# Patient Record
Sex: Female | Born: 1975 | Race: White | Hispanic: No | Marital: Married | Smoking: Never smoker
Health system: Southern US, Community
[De-identification: ages and names within clinical notes are randomized; demographics above are authoritative.]

## PROBLEM LIST (undated history)

## (undated) DIAGNOSIS — C73 Malignant neoplasm of thyroid gland: Secondary | ICD-10-CM

## (undated) DIAGNOSIS — E041 Nontoxic single thyroid nodule: Secondary | ICD-10-CM

## (undated) HISTORY — DX: Nontoxic single thyroid nodule: E04.1

---

## 1998-01-29 ENCOUNTER — Other Ambulatory Visit: Admission: RE | Admit: 1998-01-29 | Discharge: 1998-01-29 | Payer: Self-pay | Admitting: *Deleted

## 1998-04-30 ENCOUNTER — Emergency Department (HOSPITAL_COMMUNITY): Admission: EM | Admit: 1998-04-30 | Discharge: 1998-04-30 | Payer: Self-pay | Admitting: Emergency Medicine

## 2001-03-26 ENCOUNTER — Other Ambulatory Visit: Admission: RE | Admit: 2001-03-26 | Discharge: 2001-03-26 | Payer: Self-pay | Admitting: Obstetrics and Gynecology

## 2002-04-16 ENCOUNTER — Other Ambulatory Visit: Admission: RE | Admit: 2002-04-16 | Discharge: 2002-04-16 | Payer: Self-pay | Admitting: *Deleted

## 2003-04-22 ENCOUNTER — Other Ambulatory Visit: Admission: RE | Admit: 2003-04-22 | Discharge: 2003-04-22 | Payer: Self-pay | Admitting: *Deleted

## 2004-02-12 HISTORY — PX: KNEE SURGERY: SHX244

## 2006-01-28 ENCOUNTER — Inpatient Hospital Stay (HOSPITAL_COMMUNITY): Admission: AD | Admit: 2006-01-28 | Discharge: 2006-01-31 | Payer: Self-pay | Admitting: Obstetrics and Gynecology

## 2006-01-29 ENCOUNTER — Encounter (INDEPENDENT_AMBULATORY_CARE_PROVIDER_SITE_OTHER): Payer: Self-pay | Admitting: Specialist

## 2008-01-22 ENCOUNTER — Inpatient Hospital Stay (HOSPITAL_COMMUNITY): Admission: RE | Admit: 2008-01-22 | Discharge: 2008-01-27 | Payer: Self-pay | Admitting: *Deleted

## 2008-01-23 ENCOUNTER — Encounter: Payer: Self-pay | Admitting: Anesthesiology

## 2008-01-23 ENCOUNTER — Encounter (INDEPENDENT_AMBULATORY_CARE_PROVIDER_SITE_OTHER): Payer: Self-pay | Admitting: Neurosurgery

## 2008-01-23 HISTORY — PX: TUMOR EXCISION: SHX421

## 2009-01-15 ENCOUNTER — Ambulatory Visit: Payer: Self-pay | Admitting: Family Medicine

## 2009-01-15 DIAGNOSIS — J069 Acute upper respiratory infection, unspecified: Secondary | ICD-10-CM | POA: Insufficient documentation

## 2010-05-13 ENCOUNTER — Encounter: Admission: RE | Admit: 2010-05-13 | Discharge: 2010-05-13 | Payer: Self-pay | Admitting: Internal Medicine

## 2010-05-17 ENCOUNTER — Encounter: Admission: RE | Admit: 2010-05-17 | Discharge: 2010-05-17 | Payer: Self-pay | Admitting: Internal Medicine

## 2010-05-17 ENCOUNTER — Other Ambulatory Visit: Admission: RE | Admit: 2010-05-17 | Discharge: 2010-05-17 | Payer: Self-pay | Admitting: Interventional Radiology

## 2010-05-30 ENCOUNTER — Ambulatory Visit (HOSPITAL_COMMUNITY): Admission: RE | Admit: 2010-05-30 | Discharge: 2010-05-31 | Payer: Self-pay | Admitting: Surgery

## 2010-05-30 ENCOUNTER — Encounter (INDEPENDENT_AMBULATORY_CARE_PROVIDER_SITE_OTHER): Payer: Self-pay | Admitting: Surgery

## 2010-05-30 HISTORY — PX: TOTAL THYROIDECTOMY: SHX2547

## 2010-06-27 ENCOUNTER — Encounter (HOSPITAL_COMMUNITY)
Admission: RE | Admit: 2010-06-27 | Discharge: 2010-09-13 | Payer: Self-pay | Source: Home / Self Care | Attending: Endocrinology | Admitting: Endocrinology

## 2010-09-03 ENCOUNTER — Encounter: Payer: Self-pay | Admitting: Endocrinology

## 2010-09-05 ENCOUNTER — Encounter: Payer: Self-pay | Admitting: Specialist

## 2010-10-25 LAB — HCG, SERUM, QUALITATIVE

## 2010-10-26 LAB — CALCIUM: Calcium: 8 mg/dL — ABNORMAL LOW (ref 8.4–10.5)

## 2010-10-27 LAB — DIFFERENTIAL
Basophils Absolute: 0 10*3/uL (ref 0.0–0.1)
Basophils Relative: 0 % (ref 0–1)
Eosinophils Absolute: 0.1 10*3/uL (ref 0.0–0.7)
Monocytes Relative: 9 % (ref 3–12)
Neutro Abs: 5.2 10*3/uL (ref 1.7–7.7)
Neutrophils Relative %: 69 % (ref 43–77)

## 2010-10-27 LAB — CBC
MCH: 31.7 pg (ref 26.0–34.0)
MCHC: 34.2 g/dL (ref 30.0–36.0)
Platelets: 174 10*3/uL (ref 150–400)
RDW: 13.2 % (ref 11.5–15.5)

## 2010-10-27 LAB — BASIC METABOLIC PANEL
BUN: 10 mg/dL (ref 6–23)
CO2: 25 mEq/L (ref 19–32)
Calcium: 9.2 mg/dL (ref 8.4–10.5)
Creatinine, Ser: 0.83 mg/dL (ref 0.4–1.2)
GFR calc non Af Amer: >60 mL/min (ref >60)
Glucose, Bld: 81 mg/dL (ref 70–99)

## 2010-10-27 LAB — URINALYSIS, ROUTINE W REFLEX MICROSCOPIC
Bilirubin Urine: NEGATIVE
Hgb urine dipstick: NEGATIVE
Ketones, ur: NEGATIVE mg/dL
Specific Gravity, Urine: 1.019 (ref 1.005–1.030)
Urobilinogen, UA: 0.2 mg/dL (ref 0.0–1.0)

## 2010-10-27 LAB — PROTIME-INR
INR: 1.05 (ref 0.00–1.49)
Prothrombin Time: 13.9 seconds (ref 11.6–15.2)

## 2010-10-27 LAB — SURGICAL PCR SCREEN: MRSA, PCR: NEGATIVE

## 2010-12-27 NOTE — Op Note (Signed)
NAMENEYLAN, KOROMA             ACCOUNT NO.:  [id number]   MEDICAL RECORD NO.:  [id number]          PATIENT TYPE:  INP   LOCATION:  3103                         FACILITY:  [facility]   PHYSICIAN:  Coletta Memos, M.D.     DATE OF BIRTH:  08-24-1975   DATE OF PROCEDURE:  01/23/2008  DATE OF DISCHARGE:                               OPERATIVE REPORT   PREOPERATIVE DIAGNOSIS:  Intradural spinal mass, T12-L1.   POSTOPERATIVE DIAGNOSIS:  Intradural tumor T12-L1.   PROCEDURE:  T12-L1 laminectomy for tumor resection with microdissection  intradural extramedullary mass.   COMPLICATIONS:  None.   SURGEON:  Coletta Memos, M.D.   ASSISTANT:  Lovell Sheehan   INDICATIONS:  Williette Loewe is a 35 year old postpartum day 1 whom had  a baby yesterday, normal spontaneous vaginal delivery, and had an  epidural placed.  She also underwent a tubal ligation yesterday evening  at 7:00 p.m.  Today, she has had very severe back pain.  She would not  walk secondary to the pain.  A Neurology consult was called and he  ordered an MRI of the lumbar and thoracic spine.  That revealed a large  mass, which had some hemorrhagic component intradurally T12-L1.  I was  consulted and recommended emergent decompression of the spinal canal via  laminectomy and mass resection.   OPERATIVE NOTE:  Ms. Mclaren is brought to the operating room, intubated,  and placed under general anesthetic without difficulty.  A Foley  catheter was placed since he had immediate evacuation of 1600 mL of  urine.  She rolled prone onto a Wilson frame and all pressure points  were properly padded.  Her back was prepped and she was draped in  sterile fashion.  I infiltrated 24 mL 0.5% lidocaine with 1:200000  strength epinephrine into the thoracolumbar region.  I opened the skin  with a #10 blade and took this down through the thoracolumbar fascia.  I  exposed the lamina of T12 and of L1 bilaterally.  Intraoperative x-ray  showed I was in the  correct interlaminar position.  I then performed  laminectomy of L1 and T12 using both high-speed drill, Leksell rongeur,  and Kerrison punches.  This was done with Dr. Lovell Sheehan' assistance.  We  exposed the thecal sac.  We then opened the thecal sac with #15 blade,  carrying the incision over the length of the laminectomy.  What we  observed we then placed dural tackups alongside to keep the dura open.  Brought the microscope into the operative field and then immediately  observable was a dark line appearing to be blood on the right side of  the cord posteriorly.  Many of the nerve roots and cord seemed to be  bulging towards [place] posteriorly without any visible sign of a hematoma  outside of that black line.  We were then able to dissect with  microdissectors and opened the arachnoid over that black line and  encountered what was a large grayish red mass.  With further dissection,  we were then able to remove the tumor both by suction and use of  pituitary rongeurs.  With extensive dissection using microdissecting  technique, we were able to remove all of the tumor.  We received a  frozen section diagnosis while in the operating room that this was a  tumor.  Type of which is unknown.  It appeared that there may have been  nerve roots, which led into portion of the mass.  Outside of that, it  was grayish again red and easily suckable and certainly had a plane  between it and normal neural tissue.  We did not have to sacrifice any  the nerve roots during the course of the dissection.  After complete  dissection and inspection with the microscope of the resection cavity, I  then irrigated.  We achieved hemostasis.   We then closed the dura with running 4-0 Nurolon suture.  That was tied  and was watertight with a Valsalva up to approximately 40 mm of  pressure.  I then closed the thoracolumbar fascia, subcutaneous tissue,  subcuticular layers.  I closed the skin edges using a running 3-0  nylon  suture.  Sterile dressing was applied.  The patient was then rolled  prone, extubated, and was moving both lower extremities well.           ______________________________  Coletta Memos, M.D.     KC/MEDQ  D:  01/23/2008  T:  01/24/2008  Job:  [id number]

## 2010-12-27 NOTE — Op Note (Signed)
Alice Mccann, Alice Mccann             ACCOUNT NO.:  [id number]   MEDICAL RECORD NO.:  [id number]          PATIENT TYPE:  INP   LOCATION:  9139                          FACILITY:  [facility]   PHYSICIAN:  Gerri Spore B. Earlene Plater, M.D.  DATE OF BIRTH:  [birth year]   DATE OF PROCEDURE:  01/22/2008  DATE OF DISCHARGE:                               OPERATIVE REPORT   PREOPERATIVE DIAGNOSIS:  Undesired fertility.   POSTOPERATIVE DIAGNOSIS:  Undesired fertility.   PROCEDURE:  Postpartum tubal ligation with Filshie clips.   SURGEON:  Chester Holstein. Earlene Plater, MD   ANESTHESIA:  General.   FINDINGS:  Normal-appearing uterus and tubes.  Ovaries poorly seen.   SPECIMENS:  None.   BLOOD LOSS:  Minimal.   COMPLICATIONS:  None.   INDICATIONS:  The patient is status post vaginal delivery earlier today  with a functional epidural requesting permanent tubal sterilization.  The patient was advised the risks of surgery including, infection,  bleeding, damage to surrounding organs, as well as the failure rate of  tubal sterilization and the associated topic risk.   PROCEDURE:  The patient was in the operating room with epidural  anesthesia and had been dosed x2, ultimately was determined that her  epidural was apparently not functioning at this point and decision was  made to proceed with general anesthesia.  After the patient was asleep,  (and had already been prepped and draped in standard fashion).  A  transverse incision made just below the umbilicus carried sharply to the  fascia.  The fascia was divided sharply and elevated.  Posterior sheath  peritoneum entered sharply.  Trendelenburg position obtained.  The  patient rolled her left side, right tube identified, and followed to its  fimbriated end.  Filshie clip was then placed approximately at 2 cm  distal to the right cornu perpendicular to the tube and completely  across the tube.  The patient was then rolled to her right and procedure  repeated on the left  side in the same manner.   The fascia was closed with running stitch of 0 Vicryl.  The skin was  closed with 4-0 Vicryl.   The patient tolerated the procedure well with no complications.  She was  taken to recovery room, awake, alert, and in stable condition.  All  counts were correct per the operating staff.      Gerri Spore B. Earlene Plater, M.D.  Electronically Signed     WBD/MEDQ  D:  01/22/2008  T:  01/23/2008  Job:  [id number]

## 2010-12-27 NOTE — Consult Note (Signed)
Alice Mccann, Alice Mccann             ACCOUNT NO.:  [id number]   MEDICAL RECORD NO.:  [id number]          PATIENT TYPE:  INP   LOCATION:  3103                         FACILITY:  [facility]   PHYSICIAN:  Pramod P. Pearlean Brownie, MD    DATE OF BIRTH:  1976/02/03   DATE OF CONSULTATION:  DATE OF DISCHARGE:                                 CONSULTATION   REFERRING PHYSICIAN:  Angelica Pou, MD   REASON FOR REFERRAL:  Back pain and neck pain.   HISTORY OF PRESENT ILLNESS:  Alice Mccann is a 35 year old Caucasian lady  who delivered healthy child yesterday under epidural analgesia at 3  o'clock.  She was fine after that and at 7 p.m. she underwent epidural  anesthesia again for tubal ligation.  Immediately, after the procedure  she had noticed severe back pain and both leg radicular pain, right leg  more than left inflamed.  She has not been able to walk because leg  movements aggravate the pain.  She has also had bladder incontinence and  has not been able to pass urine since then.  She denies any weakness in  the legs and lack of feeling.  She has no known prior history of back  problems, gait balance and difficulties.   PAST MEDICAL HISTORY:  Unremarkable.   PAST SURGICAL HISTORY:  As above.   Her home medications are vitamins.   PHYSICAL EXAMINATION:  GENERAL: Reveals a pleasant young Caucasian lady  who seems to be in distress with severe back pain.  She is afebrile.  VITAL SIGNS: Pulse rate is 78 per minute and regular, blood pressure  125/78, sats 96% on room air.  HEAD: Nontraumatic.  NECK: Supple.  There is no bruit.  ENT:  Unremarkable.  CARDIAC: Regular heart sounds.  LUNGS: Clear to auscultation.  NEUROLOGICAL: The patient is awake, alert, and oriented to time, place,  and person.  There is no aphasia, apraxia, dysarthria.  Eye movements  are full range.  Face is symmetric, bilateral movements are normal.  Tongue is midline.  Motor system exam reveals no upper extremity drift,  symmetric strength only in the flexors.  Reflexes are brisk throughout.  Plantars are downgoing.  She was able to move both lower extremities  quite well against gravity.  Strength testing is limited to some degree  with pain.  There is no sensory loss in the legs or over the trunk.  She  has severe back pain over the tail bone.   DATA REVIEWED:  MRI scans of thoracic and lumbar spine was done  emergently and reviewed by me shows large intraspinal mass in the lower  thoracic and upper lumbar region, which is displacing the conus and  cauda causing mass effect, has faint enhancement.  There is another  smaller mass located below the sacrum.   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   PLAN:  The patient needs emergent decompression and surgery.  I have  discussed the case with  Dr. Coletta Memos, neurosurgeon who is also here  and has been discussed with the family.  The patient will be taken to  the OR for emergent surgery.  Continue narcotics for pain management.  I  had a long discussion with the patient and her husband and Dr. Franky Macho.  Kindly call for questions if needed.           ______________________________  Sunny Schlein. Pearlean Brownie, MD     PPS/MEDQ  D:  01/23/2008  T:  01/24/2008  Job:  [id number]

## 2010-12-27 NOTE — Discharge Summary (Signed)
NAMEJANIRA, Alice Mccann NO.:  [id number]   MEDICAL RECORD NO.:  [id number]          PATIENT TYPE:  INP   LOCATION:  3103                         FACILITY:  [facility]   PHYSICIAN:  Coletta Memos, M.D.     DATE OF BIRTH:  Aug 03, 1976   DATE OF ADMISSION:  01/23/2008  DATE OF DISCHARGE:  01/27/2008                               DISCHARGE SUMMARY   I am assuming that the patient's admission for her delivery will be  dictated under separate cover by her obstetrician who presided over her  care at [facility].  I will not be doing anything with regards to  that.   SURGEON:  Coletta Memos, MD   ADMITTING DIAGNOSIS:  Thoracolumbar mass.   DISCHARGE DIAGNOSIS:  Thoracolumbar intradural tumor.   COMPLICATIONS:  None.   SURGERY:  T12-L1 laminectomy for tumor resection.   DISCHARGE STATUS:  Alive and well.   HOSPITAL COURSE:  [name] was transferred emergently from [facility] after she was complaining of severe pain and inability to walk  due to that pain.  MRI was performed, it showed a large hemorrhagic mass  at the thoracolumbar region intradurally.  She was taken to the  operating room emergently and I found what was the tumor in the  intradural space.  Postoperatively, she has done well.  She has  maintained throughout normal motor function of the extremities.  She did  have urinary retention being cath for both 2500 and 1600 mL prior to her  surgery with me.  Her other history is already stated in the history and  physical.  At discharge, she did have a bowel movement just prior to her  discharge.  That may have been due to digital stimulation, nonetheless I  will send her home with Dulcolax suppositories.  Her wound is clean,  dry, and no signs of infection.  I will have her come back next week for  suture removal and if she has been unable to void, which is not  unexpected, so I will send her home with a leg bag and an appointment to  see Dr. Glenis Smoker in  approximately a week.     ______________________________  Coletta Memos, M.D.    ______________________________  Coletta Memos, M.D.    KC/MEDQ  D:  01/27/2008  T:  01/28/2008  Job:  [id number]

## 2010-12-27 NOTE — H&P (Signed)
Alice Mccann, Alice Mccann             ACCOUNT NO.:  [id number]   MEDICAL RECORD NO.:  [id number]          PATIENT TYPE:  INP   LOCATION:  3103                         FACILITY:  [facility]   PHYSICIAN:  Coletta Memos, M.D.     DATE OF BIRTH:  [birth year]   DATE OF ADMISSION:  01/23/2008  DATE OF DISCHARGE:                              HISTORY & PHYSICAL   Alice Mccann is actually a transfer from [facility].   CHIEF COMPLAINT:  Spinal mass at T12-L1, intradural.   HISTORY:  Alice Mccann is a 35 year old woman who delivered baby via  normal spontaneous vaginal delivery approximately at 04:00 p.m. on January 22, 2008.  She had an epidural placed for pain control.  That epidural  was dosed twice and then again a dose attempt was made when she  underwent tubal ligation approximately at 7:00 p.m.  It was  unsuccessful, and she was switched to a general anesthetic.  After that  tubal ligation, she complained of severe back pain.  She would not walk  as a result of that pain.  Over the course of the day, this was noted  and eventually a neurology consult was called.  The neurologist, Dr.  Buzzy Han, had actually told that she was unable to walk and had severe  back pain.  He recommended that she be sent to [facility], so that she  can undergo an MRI and he could evaluate the patient.  While she was in  the MRI scanner, Dr. Pearlean Brownie was called by the radiologist and told that  she had a large mass in the intradural space at T12-L1.  I was then  contacted.  I saw the patient down in the MRI scanner and examined her.  She had full strength in the lower extremities.  She had intact  proprioception when examined her.   Other past medical history was unremarkable.  She has sensitivities to  penicillin and sulfa.  She had 2.5 liters of residual urine in her  bladder today after voiding.  Her husband stated that she was able to  void but very small amounts.  Outside of this pain, no other neurologic  problems were identified.  She had had absolutely no symptoms prior to  her admission.  She had full strength in the lower extremities, normal  gait, no back pain other than that she associated with her pregnancy.   PHYSICAL EXAMINATION:  GENERAL:  She is alert and oriented x4, answering  all questions appropriately.  Memory and language fair.  Fund of  knowledge was normal and well-kempt, in no distress, lying in a  stretcher in the MRI suite.  She has full visual fields.  Hearing intact  to voice.  Tongue and uvula midline.  Shoulder shrug is normal.  A 5/5  strength in the upper and lower extremities.  She has a mass in the  right side of her neck, somewhat firm and nontender.  No discoloration  on the overlying skin.  LUNGS:  Lung fields clear.  HEART:  Regular rhythm and rate.  No murmurs or rubs.  Pulses good at  the wrists and feet bilaterally.  EXTREMITIES:  She has intact proprioception in the lower extremities.   MRI was reviewed, showed a large intradural mass with some hemorrhagic  component at T12-L1.  There was some blood layering in the sacral part  of the spinal canal.  No other abnormalities were noted in the thoracic  spine.   My recommendation is that she go to the operating room immediately for  decompression of the spinal canal and conus medullaris along with  postural cauda equina.  My presumption is that this is more even likely  blood.  Our radiologists felt strongly that this would be very unusual,  just should be blood, her circumstances were unique in many ways.  They  felt that a tumor should be given consideration.  I spoke at length with  Alice Mccann's husband and Alice Mccann and explaining that regardless of what  the __________ was, it had to be removed secondary to the fact  that she did have compromise of neurologic function with urinary  retention and that the mass was causing significant compression of the  neural elements.  She agreed and she was taken  to the operating room  emergently.  Followup eval.           ______________________________  Coletta Memos, M.D.     KC/MEDQ  D:  01/23/2008  T:  01/24/2008  Job:  [id number]

## 2010-12-30 NOTE — H&P (Signed)
NAMEDINESHA, Alice Mccann             ACCOUNT NO.:  [id number]   MEDICAL RECORD NO.:  [id number]          PATIENT TYPE:  INP   LOCATION:  9173                          FACILITY:  [facility]   PHYSICIAN:  Lenoard Aden, M.D.DATE OF BIRTH:  03/17/1976   DATE OF ADMISSION:  01/28/2006  DATE OF DISCHARGE:                                HISTORY & PHYSICAL   PRIORITY HISTORY AND PHYSICAL   CHIEF COMPLAINT:  Spontaneous ruptured membranes at 9 p.m. last evening.   She is a 35 year old female, G2, P0, EDD is February 24, 2006, at 36+ weeks with  spontaneous ruptured membranes at 9 p.m. on January 27, 2006.  Prenatal course  was complicated by unexplained second trimester bleeding with otherwise  normal ultrasound followup.  She is a nonsmoker and nondrinker.  She denies  __________.  Medications include prenatal vitamins.  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   PHYSICAL EXAMINATION:  GENERAL:  She is a well-developed, well-nourished  female in no acute distress.  HEENT:  Normal.  LUNGS:  Clear.  HEART:  Regular rhythm.  ABDOMEN:  Soft, gravid, nontender.  PELVIC EXAM:  The cervix per RN is closed, 50%, vertex minus 1.  EXTREMITIES:  There were no cords.  NEUROLOGIC:  Nonfocal.   IMPRESSION:  Thirty-six obstetrical with spontaneous rupture of membranes.   PLAN:  Pitocin augmentation, epidural as needed.      Lenoard Aden, M.D.     RJT/MEDQ  D:  01/28/2006  T:  01/28/2006  Job:  [id number]

## 2011-01-13 ENCOUNTER — Encounter (INDEPENDENT_AMBULATORY_CARE_PROVIDER_SITE_OTHER): Payer: Self-pay | Admitting: Surgery

## 2011-05-01 IMAGING — NM NM RAI THYROID CANCER W/ THYROGEN
1 series · 1 of 1 positions shown · non-contrast
Comparison: None.

CLINICAL DATA: Thyroid cancer

NUCLEAR MEDICINE RADIOACTIVE IODINE THERAPY FOR THYROID CANCER WITH
THYROGEN
Radiopharmaceutical: 102.7 mCi I 131.  The patient was pretreated
48 and 24 hours prior to the oral I 131, with  0.9 mg Thyrogen.

[st static image · 1 of 1 slices shown]
[im 1/1]
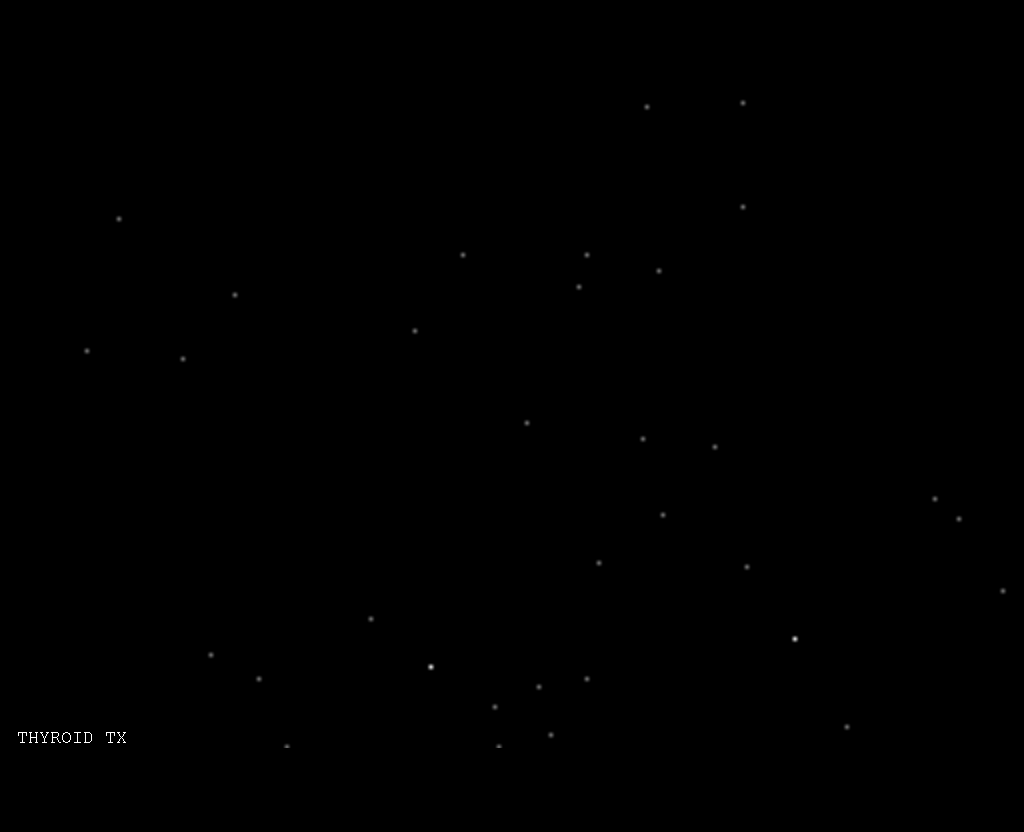

[1 of 1 positions shown; findings below may reference images not displayed]

FINDINGS: After explaining the procedure, it is complications, and
alternative therapies, informed consent was obtained.  Oral I 131
was administered.
IMPRESSION: Oral I 131 therapy for thyroid cancer as above.

## 2011-05-11 LAB — CBC
HCT: 35.2 — ABNORMAL LOW
Hemoglobin: 11.1 — ABNORMAL LOW
Hemoglobin: 12.3
MCHC: 34.2
MCV: 91.5
MCV: 93.3
RBC: 3.5 — ABNORMAL LOW
RDW: 14.6

## 2011-05-11 LAB — PROTIME-INR: Prothrombin Time: 13.2

## 2011-06-09 ENCOUNTER — Encounter: Payer: Self-pay | Admitting: Family Medicine

## 2011-06-09 ENCOUNTER — Inpatient Hospital Stay (INDEPENDENT_AMBULATORY_CARE_PROVIDER_SITE_OTHER)
Admission: RE | Admit: 2011-06-09 | Discharge: 2011-06-09 | Disposition: A | Discharge status: Home / Self Care | Payer: BC Managed Care – PPO | Source: Ambulatory Visit | Attending: Family Medicine | Admitting: Family Medicine

## 2011-06-09 DIAGNOSIS — J069 Acute upper respiratory infection, unspecified: Secondary | ICD-10-CM

## 2011-06-27 ENCOUNTER — Other Ambulatory Visit (HOSPITAL_COMMUNITY): Payer: Self-pay | Admitting: Endocrinology

## 2011-06-27 DIAGNOSIS — C73 Malignant neoplasm of thyroid gland: Secondary | ICD-10-CM

## 2011-07-17 NOTE — Progress Notes (Signed)
Summary: Possible Sinus Infection rm 4   Vital Signs:  Patient Profile:   35 Years Old Female CC:      sinus problems x 5 days Height:     67 inches Weight:      181.50 pounds O2 Sat:      99 % O2 treatment:    Room Air Temp:     98.3 degrees F oral Pulse rate:   85 / minute Resp:     16 per minute BP sitting:   109 / 77  (left arm) Cuff size:   regular  Vitals Entered By: Clemens Catholic LPN (June 09, 2011 4:09 PM)                  Updated Prior Medication List: SYNTHROID 137 MCG TABS (LEVOTHYROXINE SODIUM)  CYTOMEL 5 MCG TABS (LIOTHYRONINE SODIUM)   Current Allergies (reviewed today): ! AVELOX ! PENICILLIN ! SULFAHistory of Present Illness Chief Complaint: sinus problems x 5 days History of Present Illness:  Subjective: Patient complains of URI symptoms that started 5 days ago with a scratchy throat. + mild cough No pleuritic pain No wheezing + nasal congestion ? post-nasal drainage + sinus pain/pressure; upper and lower teeth hurt No itchy/red eyes ? left earache No hemoptysis No SOB No fever/chills No nausea No vomiting No abdominal pain No diarrhea No skin rashes + fatigue No myalgias + headache Used OTC meds without relief   REVIEW OF SYSTEMS Constitutional Symptoms      Denies fever, chills, night sweats, weight loss, weight gain, and fatigue.  Eyes       Denies change in vision, eye pain, eye discharge, glasses, contact lenses, and eye surgery. Ear/Nose/Throat/Mouth       Complains of ear pain.      Denies hearing loss/aids, change in hearing, ear discharge, dizziness, frequent runny nose, frequent nose bleeds, sinus problems, sore throat, hoarseness, and tooth pain or bleeding.  Respiratory       Complains of dry cough and productive cough.      Denies wheezing, shortness of breath, asthma, bronchitis, and emphysema/COPD.  Cardiovascular       Denies murmurs, chest pain, and tires easily with exhertion.    Gastrointestinal  Denies stomach pain, nausea/vomiting, diarrhea, constipation, blood in bowel movements, and indigestion. Genitourniary       Denies painful urination, kidney stones, and loss of urinary control. Neurological       Complains of headaches.      Denies paralysis, seizures, and fainting/blackouts. Musculoskeletal       Denies muscle pain, joint pain, joint stiffness, decreased range of motion, redness, swelling, muscle weakness, and gout.  Skin       Denies bruising, unusual mles/lumps or sores, and hair/skin or nail changes.  Psych       Denies mood changes, temper/anger issues, anxiety/stress, speech problems, depression, and sleep problems. Other Comments: pt c/o cold s/s x 5 days, now she has sinus/cheek pain and pressure, teeth pain,  and nasal congestion (green). no fever. she has taken Mucinex.   Past History:  Past Medical History: thyroid CA  Past Surgical History: Knee surgery Spinal cord tumor removal thyroidectomy  Family History: Reviewed history from 01/15/2009 and no changes required. Family History Diabetes 1st degree relative Family History Hypertension  Social History: Reviewed history from 01/15/2009 and no changes required. Occupation:Office Married Never Smoked Alcohol use-no Drug use-no   Objective:  Appearance:  Patient appears healthy, stated age, and in no acute distress  Eyes:  Pupils are equal, round, and reactive to light and accomodation.  Extraocular movement is intact.  Conjunctivae are not inflamed.  Ears:  Canals normal.  Tympanic membranes normal.   Nose:  Mildly congested turbinates.  No sinus tenderness  Pharynx:  Normal  Neck:  Supple.  Slightly tender shotty posterior nodes are palpated bilaterally.  Lungs:  Clear to auscultation.  Breath sounds are equal.  No sternum tenderness Heart:  Regular rate and rhythm without murmurs, rubs, or gallops.  Abdomen:  Nontender without masses or hepatosplenomegaly.  Bowel sounds are present.  No  CVA or flank tenderness.  Extremities:  No edema.  Skin:  No rash Assessment New Problems: UPPER RESPIRATORY INFECTION, ACUTE (ICD-465.9)  NO EVIDENCE BACTERIAL INFECTION TODAY  Plan New Medications/Changes: BENZONATATE 200 MG CAPS (BENZONATATE) One by mouth hs as needed cough  #12 x 0, 06/09/2011, Donna Christen MD AZITHROMYCIN 250 MG TABS (AZITHROMYCIN) Two tabs by mouth on day 1, then 1 tab daily on days 2 through 5 (Rx void after 06/17/11)  #6 tabs x 0, 06/09/2011, Donna Christen MD  New Orders: Pulse Oximetry (single measurment) [94760] Est. Patient Level III [16109] Planning Comments:   Treat symptomatically for now:  Increase fluid intake, begin expectorant/decongestant, topical decongestant,  cough suppressant at bedtime.  If fever/chills/sweats persist, or if not improving 5  days begin Z-pack (given Rx to hold).  Followup with PCP if not improving 7 to 10 days.   The patient and/or caregiver has been counseled thoroughly with regard to medications prescribed including dosage, schedule, interactions, rationale for use, and possible side effects and they verbalize understanding.  Diagnoses and expected course of recovery discussed and will return if not improved as expected or if the condition worsens. Patient and/or caregiver verbalized understanding.  Prescriptions: BENZONATATE 200 MG CAPS (BENZONATATE) One by mouth hs as needed cough  #12 x 0   Entered and Authorized by:   Donna Christen MD   Signed by:   Donna Christen MD on 06/09/2011   Method used:   Print then Give to Patient   RxID:   [id number] AZITHROMYCIN 250 MG TABS (AZITHROMYCIN) Two tabs by mouth on day 1, then 1 tab daily on days 2 through 5 (Rx void after 06/17/11)  #6 tabs x 0   Entered and Authorized by:   Donna Christen MD   Signed by:   Donna Christen MD on 06/09/2011   Method used:   Print then Give to Patient   RxID:   [id number]   Patient Instructions: 1)  Take Mucinex D (guaifenesin with  decongestant) twice daily for congestion. 2)  Increase fluid intake, rest. 3)  May use Afrin nasal spray (or generic oxymetazoline) twice daily for about 5 days.  Also recommend using saline nasal spray several times daily and/or saline nasal irrigation. 4)  Begin Azithromycin if not improving about 5 days or if persistent fever develops. 5)  Followup with family doctor if not improving 7 to 10 days.   Orders Added: 1)  Pulse Oximetry (single measurment) [94760] 2)  Est. Patient Level III [29528]

## 2011-07-24 ENCOUNTER — Encounter (HOSPITAL_COMMUNITY)
Admission: RE | Admit: 2011-07-24 | Discharge: 2011-07-24 | Disposition: A | Discharge status: Home / Self Care | Payer: BC Managed Care – PPO | Source: Ambulatory Visit | Attending: Endocrinology | Admitting: Endocrinology

## 2011-07-24 DIAGNOSIS — C73 Malignant neoplasm of thyroid gland: Secondary | ICD-10-CM | POA: Insufficient documentation

## 2011-07-24 DIAGNOSIS — E0789 Other specified disorders of thyroid: Secondary | ICD-10-CM | POA: Insufficient documentation

## 2011-07-24 MED ORDER — THYROTROPIN ALFA 1.1 MG IM SOLR
0.9000 mg | INTRAMUSCULAR | Status: AC
  Administered 2011-07-24: 0.9 mg via INTRAMUSCULAR

## 2011-07-25 ENCOUNTER — Ambulatory Visit (HOSPITAL_COMMUNITY)
Admission: RE | Admit: 2011-07-25 | Discharge: 2011-07-25 | Payer: BC Managed Care – PPO | Source: Ambulatory Visit | Attending: Endocrinology | Admitting: Endocrinology

## 2011-07-25 MED ORDER — THYROTROPIN ALFA 1.1 MG IM SOLR
0.9000 mg | INTRAMUSCULAR | Status: AC
  Administered 2011-07-25: 0.9 mg via INTRAMUSCULAR

## 2011-07-26 ENCOUNTER — Ambulatory Visit (HOSPITAL_COMMUNITY)
Admission: RE | Admit: 2011-07-26 | Discharge: 2011-07-26 | Disposition: A | Discharge status: Home / Self Care | Payer: BC Managed Care – PPO | Source: Ambulatory Visit | Attending: Endocrinology | Admitting: Endocrinology

## 2011-07-28 ENCOUNTER — Ambulatory Visit (HOSPITAL_COMMUNITY)
Admission: RE | Admit: 2011-07-28 | Discharge: 2011-07-28 | Disposition: A | Discharge status: Home / Self Care | Payer: BC Managed Care – PPO | Source: Ambulatory Visit | Attending: Endocrinology | Admitting: Endocrinology

## 2011-07-28 DIAGNOSIS — C73 Malignant neoplasm of thyroid gland: Secondary | ICD-10-CM | POA: Insufficient documentation

## 2011-07-28 MED ORDER — SODIUM IODIDE I 131 CAPSULE
4.0000 | Freq: Once | INTRAVENOUS | Status: AC | PRN
  Administered 2011-07-28: 4 via ORAL

## 2011-08-25 ENCOUNTER — Encounter (INDEPENDENT_AMBULATORY_CARE_PROVIDER_SITE_OTHER): Payer: Self-pay | Admitting: Surgery

## 2011-08-28 ENCOUNTER — Ambulatory Visit (INDEPENDENT_AMBULATORY_CARE_PROVIDER_SITE_OTHER): Payer: BC Managed Care – PPO | Admitting: Surgery

## 2011-08-28 ENCOUNTER — Encounter (INDEPENDENT_AMBULATORY_CARE_PROVIDER_SITE_OTHER): Payer: Self-pay | Admitting: Surgery

## 2011-08-28 VITALS — BP 102/70 | HR 88 | Temp 97.2°F | Resp 16 | Ht 67.0 in | Wt 181.2 lb

## 2011-08-28 DIAGNOSIS — C73 Malignant neoplasm of thyroid gland: Secondary | ICD-10-CM

## 2011-08-28 NOTE — Patient Instructions (Signed)
Call if any change in self exam. tmg

## 2011-08-28 NOTE — Progress Notes (Signed)
Visit Diagnoses: 1. 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 She is followed by her endocrinologist and her primary care physician. In December 2012 she underwent a total body iodine scan which showed no evidence of recurrent or metastatic disease. Thyroglobulin levels in December 2012 remained undetectable.  PERTINENT REVIEW OF SYSTEMS: Patient notes normal voice quality. Denies dysphagia. Denies dyspnea. Denies tremors. Denies palpitations.  EXAM: HEENT: normocephalic; pupils equal and reactive; sclerae clear; dentition good; mucous membranes moist NECK:  Well healed incision with good cosmetic result; no nodules on palpation; symmetric on extension; no palpable anterior or posterior cervical lymphadenopathy; no supraclavicular masses; no tenderness CHEST: clear to auscultation bilaterally without rales, rhonchi, or wheezes CARDIAC: regular rate and rhythm without significant murmur; peripheral pulses are full EXT:  non-tender without edema; no deformity NEURO: no gross focal deficits; no sign of tremor   IMPRESSION: Follicular variant of papillary thyroid carcinoma, status post total thyroidectomy, no clinical evidence of recurrent disease.  PLAN: Patient will continue followup and monitoring at the office of her endocrinologist and primary care physician. She will return to see me as needed.  Velora Heckler, MD, FACS General & Endocrine Surgery [facility]

## 2011-10-01 ENCOUNTER — Encounter: Payer: Self-pay | Admitting: Emergency Medicine

## 2011-10-01 ENCOUNTER — Emergency Department
Admission: EM | Admit: 2011-10-01 | Discharge: 2011-10-01 | Disposition: A | Discharge status: Home / Self Care | Payer: BC Managed Care – PPO | Source: Home / Self Care | Attending: Family Medicine | Admitting: Family Medicine

## 2011-10-01 DIAGNOSIS — R42 Dizziness and giddiness: Secondary | ICD-10-CM

## 2011-10-01 MED ORDER — MECLIZINE HCL 25 MG PO TABS: ORAL_TABLET | ORAL | Status: DC

## 2011-10-01 NOTE — ED Provider Notes (Signed)
History     CSN: [id number]  Arrival date & time 10/01/11  1125   First MD Initiated Contact with Patient 10/01/11 1149      Chief Complaint  Patient presents with  . Facial Pain     HPI Comments: Patient complains of 2 day history of mild dizziness and nausea without vomiting.  She then developed  URI symptoms beginning with a mild sore throat (now improved), mild nasal congestion, mild cough, and bilateral ear discomfort.    Complains of fatigue and initial myalgias.  There has been no pleuritic pain, shortness of breath, or wheezes.  No other neurologic symptoms.  The history is provided by the patient.    Past Medical History  Diagnosis Date  . Thyroid nodule     Past Surgical History  Procedure Date  . Tumor excision 01/23/08    spine  . Knee surgery 02/2004  . Total thyroidectomy 05/30/2010    Family History  Problem Relation Age of Onset  . Diabetes Sister     History  Substance Use Topics  . Smoking status: Never Smoker   . Smokeless tobacco: Not on file  . Alcohol Use: No    OB History    Grav Para Term Preterm Abortions TAB SAB Ect Mult Living                  Review of Systems + sore throat, minimal + cough, mild No pleuritic pain No wheezing + nasal congestion ? post-nasal drainage No sinus pain/pressure No itchy/red eyes ? earache No hemoptysis No SOB + fever, + chills + nausea No vomiting No abdominal pain No diarrhea No urinary symptoms No skin rashes + fatigue + myalgias + headache Used OTC meds without relief (Sudafed) Allergies  Avelox; Moxifloxacin; Penicillins; and Sulfonamide derivatives  Home Medications   Current Outpatient Rx  Name Route Sig Dispense Refill  . LEVOTHYROXINE SODIUM 137 MCG PO TABS Oral Take 137 mcg by mouth daily.    Marland Kitchen LIOTHYRONINE SODIUM 5 MCG PO TABS Oral Take 5 mcg by mouth daily.    Marland Kitchen MECLIZINE HCL 25 MG PO TABS  Take one tab by mouth 2 or 3 times daily as needed for dizziness and nausea. 20  tablet 0  . TRI-SPRINTEC PO Oral Take by mouth daily.        BP 112/81  Pulse 67  Temp(Src) 98.2 F (36.8 C) (Oral)  Resp 18  Ht 5\' 6"  (1.676 m)  Wt 177 lb (80.287 kg)  BMI 28.57 kg/m2  SpO2 98%  Physical Exam Nursing notes and Vital Signs reviewed. Appearance:  Patient appears healthy, stated age, and in no acute distress Eyes:  Pupils are equal, round, and reactive to light and accomodation.  Extraocular movement is intact.  Conjunctivae are not inflamed.  Fundi benign.  No nystagmus.  Ears:  Canals normal.  Tympanic membranes normal.  Nose:  Mildly congested turbinates.  No sinus tenderness.   Pharynx:  Normal Neck:  Supple.  Slightly tender shotty posterior nodes are palpated bilaterally  Lungs:  Clear to auscultation.  Breath sounds are equal.  Heart:  Regular rate and rhythm without murmurs, rubs, or gallops.  Abdomen:  Nontender without masses or hepatosplenomegaly.  Bowel sounds are present.  No CVA or flank tenderness.  Skin:  No rash present.   ED Course  Procedures  none      1. Vertigo       MDM  There is no evidence of bacterial infection today.  Suspect early viral URI Rx written for Antivert Treat symptomatically for now: Recommend BRAT diet today.  Rest, increased fluids If cold like symptoms develop, begin Mucinex D (guaifenesin with decongestant) twice daily for congestion.  Increase fluid intake, rest. May use Afrin nasal spray (or generic oxymetazoline) twice daily for about 5 days.  Also recommend using saline nasal spray several times daily and saline nasal irrigation (AYR is a common brand) Stop all antihistamines for now, and other non-prescription cough/cold preparations. May take Ibuprofen 200mg , 4 tabs every 8 hours with food for headache, or Tylenol If cough develops may take Delsym Cough Suppressant at bedtime for nighttime cough.  Followup with PCP if not improving.         Donna Christen, MD 10/02/11 1550

## 2011-10-01 NOTE — ED Notes (Signed)
Sinus/facial pain, joint pain, eye pain with nausea yesterday; x 48 hours.

## 2011-10-23 ENCOUNTER — Encounter (INDEPENDENT_AMBULATORY_CARE_PROVIDER_SITE_OTHER): Payer: Self-pay

## 2012-04-01 ENCOUNTER — Other Ambulatory Visit: Payer: Self-pay | Admitting: Internal Medicine

## 2012-04-01 DIAGNOSIS — R1310 Dysphagia, unspecified: Secondary | ICD-10-CM

## 2012-04-08 ENCOUNTER — Ambulatory Visit
Admission: RE | Admit: 2012-04-08 | Discharge: 2012-04-08 | Disposition: A | Discharge status: Home / Self Care | Payer: BC Managed Care – PPO | Source: Ambulatory Visit | Attending: Internal Medicine | Admitting: Internal Medicine

## 2012-04-08 DIAGNOSIS — R1310 Dysphagia, unspecified: Secondary | ICD-10-CM

## 2012-04-17 ENCOUNTER — Other Ambulatory Visit: Payer: Self-pay | Admitting: Endocrinology

## 2012-04-17 DIAGNOSIS — C73 Malignant neoplasm of thyroid gland: Secondary | ICD-10-CM

## 2012-04-21 ENCOUNTER — Emergency Department
Admission: EM | Admit: 2012-04-21 | Discharge: 2012-04-21 | Disposition: A | Discharge status: Home / Self Care | Payer: BC Managed Care – PPO | Source: Home / Self Care | Attending: Emergency Medicine | Admitting: Emergency Medicine

## 2012-04-21 DIAGNOSIS — J0100 Acute maxillary sinusitis, unspecified: Secondary | ICD-10-CM

## 2012-04-21 MED ORDER — AZITHROMYCIN 250 MG PO TABS: ORAL_TABLET | ORAL | Status: AC

## 2012-04-21 NOTE — ED Notes (Signed)
States she has had sinus pressure for 9 days, states she has been taking steroid nasal spray w/out relief.  Also BL ears feels clogged.

## 2012-04-21 NOTE — ED Provider Notes (Signed)
History     CSN: [id number]  Arrival date & time 04/21/12  1248   First MD Initiated Contact with Patient 04/21/12 1301      Chief Complaint  Patient presents with  . Facial Pain    HPI SINUSITIS  Onset: 9 days Facial/sinus pressure with discolored nasal mucus.    Severity: moderate Tried OTC meds and Flonase without significant relief. She cannot tolerate Afrin or decongestants, so she is avoiding these  Symptoms:  + Low-grade Fever  + URI prodrome with nasal congestion + Minimal swollen neck glands + mild Sinus Headache + mild ear pressure  No Allergy symptoms No significant Sore Throat No eye symptoms     No significant Cough No chest pain No shortness of breath  No wheezing  No Abdominal Pain No Nausea No Vomiting No diarrhea  No Myalgias No focal neurologic symptoms No syncope No Rash  No Urinary symptoms          Past Medical History  Diagnosis Date  . Thyroid nodule     Past Surgical History  Procedure Date  . Tumor excision 01/23/08    spine  . Knee surgery 02/2004  . Total thyroidectomy 05/30/2010    Family History  Problem Relation Age of Onset  . Diabetes Sister     History  Substance Use Topics  . Smoking status: Never Smoker   . Smokeless tobacco: Not on file  . Alcohol Use: No    OB History    Grav Para Term Preterm Abortions TAB SAB Ect Mult Living                  Review of Systems  All other systems reviewed and are negative.    Allergies  Avelox; Moxifloxacin; Penicillins; and Sulfonamide derivatives  Home Medications   Current Outpatient Rx  Name Route Sig Dispense Refill  . LEVOTHYROXINE SODIUM 137 MCG PO TABS Oral Take 137 mcg by mouth daily.    Marland Kitchen LIOTHYRONINE SODIUM 5 MCG PO TABS Oral Take 5 mcg by mouth daily.    Marland Kitchen MECLIZINE HCL 25 MG PO TABS  Take one tab by mouth 2 or 3 times daily as needed for dizziness and nausea. 20 tablet 0  . TRI-SPRINTEC PO Oral Take by mouth daily.        BP 106/73   Pulse 82  Temp 98.3 F (36.8 C) (Oral)  Resp 16  Ht 5\' 7"  (1.702 m)  Wt 185 lb (83.915 kg)  BMI 28.97 kg/m2  SpO2 98%  LMP 04/11/2012  Physical Exam  Nursing note and vitals reviewed. Constitutional: She is oriented to person, place, and time. She appears well-developed and well-nourished. No distress.  HENT:  Head: Normocephalic and atraumatic.  Right Ear: Tympanic membrane, external ear and ear canal normal.  Left Ear: Tympanic membrane, external ear and ear canal normal.  Nose: Mucosal edema and rhinorrhea present. Right sinus exhibits maxillary sinus tenderness. Left sinus exhibits maxillary sinus tenderness.  Mouth/Throat: Oropharynx is clear and moist. No oral lesions. No oropharyngeal exudate.  Eyes: Right eye exhibits no discharge. Left eye exhibits no discharge. No scleral icterus.  Neck: Neck supple.  Cardiovascular: Normal rate, regular rhythm and normal heart sounds.   Pulmonary/Chest: Effort normal and breath sounds normal. She has no wheezes. She has no rales.  Lymphadenopathy:    She has no cervical adenopathy.  Neurological: She is alert and oriented to person, place, and time.  Skin: Skin is warm and dry.  ED Course  Procedures (including critical care time)  Labs Reviewed - No data to display No results found.      MDM  Acute maxillary sinusitis. Treatment options discussed. Noted drug allergies to penicillin, Avelox, sulfa.--- She states she's taken Zithromax for sinus infections in the past and that's worked great without side effects. Therefore, Z-Pak prescribed. May continue the Flonase. Precautions discussed. Followup with PCP if not improved in 10 days, sooner if worse or new symptoms. Red flags discussed. She voiced understanding and agreement.        Lajean Manes, MD 04/21/12 1318

## 2012-05-10 ENCOUNTER — Ambulatory Visit
Admission: RE | Admit: 2012-05-10 | Discharge: 2012-05-10 | Disposition: A | Discharge status: Home / Self Care | Payer: BC Managed Care – PPO | Source: Ambulatory Visit | Attending: Endocrinology | Admitting: Endocrinology

## 2012-05-10 DIAGNOSIS — C73 Malignant neoplasm of thyroid gland: Secondary | ICD-10-CM

## 2012-09-04 ENCOUNTER — Emergency Department
Admission: EM | Admit: 2012-09-04 | Discharge: 2012-09-04 | Disposition: A | Discharge status: Home / Self Care | Payer: BC Managed Care – PPO | Source: Home / Self Care | Attending: Family Medicine | Admitting: Family Medicine

## 2012-09-04 ENCOUNTER — Encounter: Payer: Self-pay | Admitting: *Deleted

## 2012-09-04 DIAGNOSIS — J069 Acute upper respiratory infection, unspecified: Secondary | ICD-10-CM

## 2012-09-04 MED ORDER — AZITHROMYCIN 250 MG PO TABS: ORAL_TABLET | ORAL | Status: DC

## 2012-09-04 NOTE — ED Notes (Signed)
Patient c/o sinus and teeth pain x 2 days. Productive cough and left ear pain. Denies fever.

## 2012-09-04 NOTE — ED Provider Notes (Signed)
History     CSN: [id number]  Arrival date & time 09/04/12  1158   First MD Initiated Contact with Patient 09/04/12 1206      Chief Complaint  Patient presents with  . Sinus Problem  . Cough   HPI  URI Symptoms Onset: 2 days  Description: rhinorrhea, nasal congestion, sinus pressure, cough, mild tooth pain  Modifying factors:  Prior hx/o sinus infections. Feels like early sinus infection per pt.   Symptoms Nasal discharge: yes Fever: no Sore throat: no Cough: yes Wheezing: no Ear pain: no GI symptoms: no Sick contacts: no  Red Flags  Stiff neck: no Dyspnea: no Rash: no Swallowing difficulty: no  Sinusitis Risk Factors Headache/face pain: mild Double sickening: no tooth pain: yes  Allergy Risk Factors Sneezing: no Itchy scratchy throat: no Seasonal symptoms: no  Flu Risk Factors Headache: no muscle aches: no severe fatigue: no   Past Medical History  Diagnosis Date  . Thyroid nodule     Past Surgical History  Procedure Date  . Tumor excision 01/23/08    spine  . Knee surgery 02/2004  . Total thyroidectomy 05/30/2010    Family History  Problem Relation Age of Onset  . Diabetes Sister     History  Substance Use Topics  . Smoking status: Never Smoker   . Smokeless tobacco: Never Used  . Alcohol Use: Yes    OB History    Grav Para Term Preterm Abortions TAB SAB Ect Mult Living                  Review of Systems  All other systems reviewed and are negative.    Allergies  Avelox; Moxifloxacin; Penicillins; and Sulfonamide derivatives  Home Medications   Current Outpatient Rx  Name  Route  Sig  Dispense  Refill  . AZITHROMYCIN 250 MG PO TABS      Take 2 tabs PO x 1 dose, then 1 tab PO QD x 4 days   6 tablet   0   . LEVOTHYROXINE SODIUM 137 MCG PO TABS   Oral   Take 137 mcg by mouth daily.         Marland Kitchen LIOTHYRONINE SODIUM 5 MCG PO TABS   Oral   Take 5 mcg by mouth daily.         Marland Kitchen MECLIZINE HCL 25 MG PO TABS      Take  one tab by mouth 2 or 3 times daily as needed for dizziness and nausea.   20 tablet   0   . TRI-SPRINTEC PO   Oral   Take by mouth daily.             BP 108/75  Pulse 89  Temp 98.5 F (36.9 C) (Oral)  Resp 14  Ht 5\' 7"  (1.702 m)  Wt 182 lb (82.555 kg)  BMI 28.51 kg/m2  SpO2 97%  LMP 08/09/2012  Physical Exam  Constitutional: She appears well-developed and well-nourished.  HENT:  Head: Normocephalic and atraumatic.  Right Ear: External ear normal.  Left Ear: External ear normal.       +nasal erythema, rhinorrhea bilaterally, + post oropharyngeal erythema  + maxillary tenderness to palpation bilaterally (mild)   Eyes: Conjunctivae normal are normal. Pupils are equal, round, and reactive to light.  Neck: Normal range of motion. Neck supple.  Cardiovascular: Normal rate, regular rhythm and normal heart sounds.   Pulmonary/Chest: Effort normal and breath sounds normal.  Abdominal: Soft.  Musculoskeletal: Normal range of  motion.  Neurological: She is alert.  Skin: Skin is warm.    ED Course  Procedures (including critical care time)  Labs Reviewed - No data to display No results found.   1. URI (upper respiratory infection)       MDM  Likely viral process.  Discussed general care and infectious red flags.  Prophylactic zpak Rx given if sxs present > 7-10 days.  Otherwise follow up as needed.     The patient and/or caregiver has been counseled thoroughly with regard to treatment plan and/or medications prescribed including dosage, schedule, interactions, rationale for use, and possible side effects and they verbalize understanding. Diagnoses and expected course of recovery discussed and will return if not improved as expected or if the condition worsens. Patient and/or caregiver verbalized understanding.             Doree Albee, MD 09/04/12 1242

## 2014-05-12 ENCOUNTER — Encounter: Payer: Self-pay | Admitting: Emergency Medicine

## 2014-05-12 ENCOUNTER — Emergency Department (INDEPENDENT_AMBULATORY_CARE_PROVIDER_SITE_OTHER)
Admission: EM | Admit: 2014-05-12 | Discharge: 2014-05-12 | Disposition: A | Discharge status: Home / Self Care | Payer: BC Managed Care – PPO | Source: Home / Self Care

## 2014-05-12 DIAGNOSIS — A499 Bacterial infection, unspecified: Secondary | ICD-10-CM

## 2014-05-12 DIAGNOSIS — B9689 Other specified bacterial agents as the cause of diseases classified elsewhere: Secondary | ICD-10-CM

## 2014-05-12 DIAGNOSIS — J329 Chronic sinusitis, unspecified: Secondary | ICD-10-CM

## 2014-05-12 NOTE — ED Provider Notes (Signed)
CSN: [id number]     Arrival date & time 05/12/14  0807 History   None    Chief Complaint  Patient presents with  . Facial Pain  . Dental Pain  . Otalgia   (Consider location/radiation/quality/duration/timing/severity/associated sxs/prior Treatment) HPI Pt presents to the clinic with 6 days of sinus pain, dental pain, dizziness, and left ear pain with some nasal congestion. Denies any cough, wheezing, SOB, nausea or fever. Tried Excedrin migraine and sudafed with little benefit.   Past Medical History  Diagnosis Date  . Thyroid nodule    Past Surgical History  Procedure Laterality Date  . Tumor excision  01/23/08    spine  . Knee surgery  02/2004  . Total thyroidectomy  05/30/2010   Family History  Problem Relation Age of Onset  . Diabetes Sister   . Hypertension Mother    History  Substance Use Topics  . Smoking status: Never Smoker   . Smokeless tobacco: Never Used  . Alcohol Use: No   OB History   Grav Para Term Preterm Abortions TAB SAB Ect Mult Living                 Review of Systems  All other systems reviewed and are negative.   Allergies  Avelox; Moxifloxacin; Penicillins; and Sulfonamide derivatives  Home Medications   Prior to Admission medications   Medication Sig Start Date End Date Taking? Authorizing Provider  azithromycin (ZITHROMAX) 250 MG tablet Take 2 tabs PO x 1 dose, then 1 tab PO QD x 4 days 09/04/12   Shanda Howells, MD  levothyroxine (SYNTHROID, LEVOTHROID) 137 MCG tablet Take 137 mcg by mouth daily.    Historical Provider, MD  liothyronine (CYTOMEL) 5 MCG tablet Take 5 mcg by mouth daily.    Historical Provider, MD  meclizine (ANTIVERT) 25 MG tablet Take one tab by mouth 2 or 3 times daily as needed for dizziness and nausea. 10/01/11   Kandra Nicolas, MD  Norgestim-Eth Radene Journey Triphasic (TRI-SPRINTEC PO) Take by mouth daily.      Historical Provider, MD   BP 103/71  Pulse 71  Temp(Src) 98.1 F (36.7 C) (Oral)  Resp 16  Ht 5\' 7"  (1.702  m)  Wt 184 lb (83.462 kg)  BMI 28.81 kg/m2  SpO2 100% Physical Exam  Constitutional: She is oriented to person, place, and time. She appears well-developed and well-nourished.  HENT:  Head: Normocephalic and atraumatic.  Right Ear: External ear normal.  Left Ear: External ear normal.  TM's clear.  Bilateral maxillary sinus tenderness to palpation.  oropharynx erythematous but no tonsillar swelling or exudate.   Eyes: Conjunctivae are normal. Right eye exhibits no discharge. Left eye exhibits no discharge.  Neck: Normal range of motion. Neck supple.  Cardiovascular: Normal rate, regular rhythm and normal heart sounds.   Pulmonary/Chest: Effort normal and breath sounds normal. She has no wheezes.  Lymphadenopathy:    She has no cervical adenopathy.  Neurological: She is alert and oriented to person, place, and time.  Skin: Skin is dry.  Psychiatric: She has a normal mood and affect. Her behavior is normal.    ED Course  Procedures (including critical care time) Labs Review Labs Reviewed - No data to display  Imaging Review No results found.   MDM   1. Bacterial sinusitis    EMR down at time of visit. Handwritten rx for zpak due to allergies was written.  Discussed symptomatic care.  Follow up with PCP if not improving or if  worsening.     Donella Stade, PA-C 05/12/14 [phone]

## 2014-05-12 NOTE — Discharge Instructions (Signed)

## 2014-05-12 NOTE — ED Notes (Signed)
Pt c/o sinus pain, dental pain, intermittent dizziness, and LT ear ache with minimal nasal congestion x 6 days. Denies fever.

## 2014-05-25 ENCOUNTER — Emergency Department (INDEPENDENT_AMBULATORY_CARE_PROVIDER_SITE_OTHER)
Admission: EM | Admit: 2014-05-25 | Discharge: 2014-05-25 | Disposition: A | Discharge status: Home / Self Care | Payer: BC Managed Care – PPO | Source: Home / Self Care | Attending: Emergency Medicine | Admitting: Emergency Medicine

## 2014-05-25 ENCOUNTER — Encounter: Payer: Self-pay | Admitting: Emergency Medicine

## 2014-05-25 ENCOUNTER — Emergency Department (INDEPENDENT_AMBULATORY_CARE_PROVIDER_SITE_OTHER): Payer: BC Managed Care – PPO

## 2014-05-25 DIAGNOSIS — S52511A Displaced fracture of right radial styloid process, initial encounter for closed fracture: Secondary | ICD-10-CM

## 2014-05-25 DIAGNOSIS — S52514A Nondisplaced fracture of right radial styloid process, initial encounter for closed fracture: Secondary | ICD-10-CM

## 2014-05-25 DIAGNOSIS — W1830XA Fall on same level, unspecified, initial encounter: Secondary | ICD-10-CM

## 2014-05-25 HISTORY — DX: Malignant neoplasm of thyroid gland: C73

## 2014-05-25 NOTE — ED Notes (Signed)
Pt c/o RT lower arm pain and bruising post fall at home 2 days ago.

## 2014-05-25 NOTE — Discharge Instructions (Signed)
Radial Fracture You have a broken bone (fracture) of the forearm. This is the part of your arm between the elbow and your wrist. Your forearm is made up of two bones. These are the radius and ulna. Your fracture is in the radial shaft. This is the bone in your forearm located on the thumb side. A cast or splint is used to protect and keep your injured bone from moving. The cast or splint will be on generally for about 5 to 6 weeks, with individual variations. HOME CARE INSTRUCTIONS   Keep the injured part elevated while sitting or lying down. Keep the injury above the level of your heart (the center of the chest). This will decrease swelling and pain.  Apply ice to the injury for 15-20 minutes, 03-04 times per day while awake, for 2 days. Put the ice in a plastic bag and place a towel between the bag of ice and your cast or splint.  Move your fingers to avoid stiffness and minimize swelling.  If you have a plaster or fiberglass cast:  Do not try to scratch the skin under the cast using sharp or pointed objects.  Check the skin around the cast every day. You may put lotion on any red or sore areas.  Keep your cast dry and clean.  If you have a plaster splint:  Wear the splint as directed.  You may loosen the elastic around the splint if your fingers become numb, tingle, or turn cold or blue.  Do not put pressure on any part of your cast or splint. It may break. Rest your cast only on a pillow for the first 24 hours until it is fully hardened.  Your cast or splint can be protected during bathing with a plastic bag. Do not lower the cast or splint into water.  Only take over-the-counter or prescription medicines for pain, discomfort, or fever as directed by your caregiver. SEEK IMMEDIATE MEDICAL CARE IF:   Your cast gets damaged or breaks.  You have more severe pain or swelling than you did before getting the cast.  You have severe pain when stretching your fingers.  There is a bad  smell, new stains and/or pus-like (purulent) drainage coming from under the cast.  Your fingers or hand turn pale or blue and become cold or your loose feeling. Document Released: 01/11/2006 Document Revised: 10/23/2011 Document Reviewed: 04/09/2006 [facility] Patient Information 2015 [place], [place]. This information is not intended to replace advice given to you by your health care provider. Make sure you discuss any questions you have with your health care provider.

## 2014-05-25 NOTE — ED Provider Notes (Signed)
CSN: [id number]     Arrival date & time 05/25/14  0932 History   First MD Initiated Contact with Patient 05/25/14 [phone]     Chief Complaint  Patient presents with  . Arm Injury   (Consider location/radiation/quality/duration/timing/severity/associated sxs/prior Treatment) Patient is a 38 y.o. female presenting with arm injury. The history is provided by the patient. No language interpreter was used.  Arm Injury Location:  Arm Time since incident:  2 days Injury: yes   Mechanism of injury comment:  Direct blow Arm location:  R arm Pain details:    Quality:  Aching   Radiates to:  Does not radiate   Severity:  Moderate   Onset quality:  Gradual   Past Medical History  Diagnosis Date  . Thyroid nodule   . Thyroid cancer    Past Surgical History  Procedure Laterality Date  . Tumor excision  01/23/08    spine  . Knee surgery  02/2004  . Total thyroidectomy  05/30/2010   Family History  Problem Relation Age of Onset  . Diabetes Sister   . Hypertension Mother    History  Substance Use Topics  . Smoking status: Never Smoker   . Smokeless tobacco: Never Used  . Alcohol Use: No   OB History   Grav Para Term Preterm Abortions TAB SAB Ect Mult Living                 Review of Systems  Musculoskeletal: Positive for joint swelling and myalgias.  Skin: Positive for color change.  All other systems reviewed and are negative.   Allergies  Avelox; Moxifloxacin; Penicillins; and Sulfonamide derivatives  Home Medications   Prior to Admission medications   Medication Sig Start Date End Date Taking? Authorizing Provider  azithromycin (ZITHROMAX) 250 MG tablet Take 2 tabs PO x 1 dose, then 1 tab PO QD x 4 days 09/04/12   Shanda Howells, MD  levothyroxine (SYNTHROID, LEVOTHROID) 137 MCG tablet Take 137 mcg by mouth daily.    Historical Provider, MD  liothyronine (CYTOMEL) 5 MCG tablet Take 5 mcg by mouth daily.    Historical Provider, MD  meclizine (ANTIVERT) 25 MG tablet Take  one tab by mouth 2 or 3 times daily as needed for dizziness and nausea. 10/01/11   Kandra Nicolas, MD  Norgestim-Eth Radene Journey Triphasic (TRI-SPRINTEC PO) Take by mouth daily.      Historical Provider, MD   BP 106/71  Pulse 70  Temp(Src) 98.1 F (36.7 C) (Oral)  Resp 16  Ht 5\' 7"  (1.702 m)  Wt 186 lb (84.369 kg)  BMI 29.12 kg/m2  SpO2 98%  LMP 05/18/2014 Physical Exam  Nursing note and vitals reviewed. Constitutional: She is oriented to person, place, and time. She appears well-developed and well-nourished.  HENT:  Head: Normocephalic.  Eyes: EOM are normal.  Neck: Normal range of motion.  Pulmonary/Chest: Effort normal.  Abdominal: She exhibits no distension.  Musculoskeletal: She exhibits tenderness.  Tender forearm, bruised,  from  Neurological: She is alert and oriented to person, place, and time.  Psychiatric: She has a normal mood and affect.    ED Course  Procedures (including critical care time) Labs Review Labs Reviewed - No data to display  Imaging Review Dg Forearm Right  05/25/2014   CLINICAL DATA:  Fall 2 days ago while walking a dog. Pain and bruising along forearm shaft. Initial encounter.  EXAM: RIGHT FOREARM - 2 VIEW  COMPARISON:  None.  FINDINGS: Soft tissue swelling along the  posterior forearm. No underlying bony abnormality. No fracture, subluxation or dislocation underlying the proximal forearm soft tissue swelling. However, there is a nondisplaced fracture involving the radial styloid. No ulnar abnormality.  IMPRESSION: Nondisplaced radial styloid fracture.   Electronically Signed   By: Rolm Baptise M.D.   On: 05/25/2014 10:24     MDM   1. Radial styloid fracture, right, closed, initial encounter    AVS Splint Ice elevate Tylenol See Dr. Darene Lamer for recheck in 1 week    Fransico Meadow, PA-C 05/25/14 1056

## 2014-05-28 NOTE — ED Provider Notes (Signed)
Medical history/examination/treatment/procedure(s) were performed by non-physician provider and as supervising physician I was immediately available for consultation/collaboration.  Jacqulyn Cane, MD 05/28/14 [phone]

## 2014-06-01 ENCOUNTER — Ambulatory Visit (INDEPENDENT_AMBULATORY_CARE_PROVIDER_SITE_OTHER): Payer: BC Managed Care – PPO | Admitting: Sports Medicine

## 2014-06-01 ENCOUNTER — Encounter: Payer: Self-pay | Admitting: Sports Medicine

## 2014-06-01 VITALS — BP 113/79 | HR 73 | Ht 67.0 in | Wt 188.0 lb

## 2014-06-01 DIAGNOSIS — S52511A Displaced fracture of right radial styloid process, initial encounter for closed fracture: Secondary | ICD-10-CM | POA: Diagnosis not present

## 2014-06-01 DIAGNOSIS — S52513A Displaced fracture of unspecified radial styloid process, initial encounter for closed fracture: Secondary | ICD-10-CM | POA: Insufficient documentation

## 2014-06-01 NOTE — Progress Notes (Signed)
   Subjective:    I'm seeing this patient as a consultation for:  Dr. Jacqulyn Cane  CC: Wrist fracture  HPI: This is a very pleasant 38 year old female, she comes in one week after falling onto her right arm, outstretched, with immediate pain, swelling, bruising. She was seen in urgent care and placed in a wrist brace. She returns today with pain moderate, persistent but slightly improved. It is localized over the radial styloid process without radiation.  Past medical history, Surgical history, Family history not pertinant except as noted below, Social history, Allergies, and medications have been entered into the medical record, reviewed, and no changes needed.   Review of Systems: No headache, visual changes, nausea, vomiting, diarrhea, constipation, dizziness, abdominal pain, skin rash, fevers, chills, night sweats, weight loss, swollen lymph nodes, body aches, joint swelling, muscle aches, chest pain, shortness of breath, mood changes, visual or auditory hallucinations.   Objective:   General: Well Developed, well nourished, and in no acute distress.  Neuro/Psych: Alert and oriented x3, extra-ocular muscles intact, able to move all 4 extremities, sensation grossly intact. Skin: Warm and dry, no rashes noted.  Respiratory: Not using accessory muscles, speaking in full sentences, trachea midline.  Cardiovascular: Pulses palpable, no extremity edema. Abdomen: Does not appear distended. Right Wrist: Inspection normal with no visible erythema or swelling. ROM smooth and normal with good flexion and extension and ulnar/radial deviation that is symmetrical with opposite wrist. Tender to palpation over the distal radius.Marland Kitchen Negative Finkelstein, tinel's and phalens. Negative Watson's test.  Thumb spica cast was placed.  X-rays show a nondisplaced fracture through the base of the radial styloid process.  Impression and Recommendations:   This case required medical decision making of  moderate complexity.

## 2014-06-01 NOTE — Assessment & Plan Note (Signed)
One week post fracture, no swelling. Thumb spica cast placed today. Return in 4 weeks for cast removal. X-ray before visit.  I billed a fracture code for this encounter, all subsequent visits will be post-op checks in the global period.

## 2014-06-04 NOTE — ED Provider Notes (Signed)
Agree with exam, assessment, and plan.   Kandra Nicolas, MD 06/04/14 [phone]

## 2014-06-29 ENCOUNTER — Encounter: Payer: Self-pay | Admitting: Sports Medicine

## 2014-06-29 ENCOUNTER — Ambulatory Visit (INDEPENDENT_AMBULATORY_CARE_PROVIDER_SITE_OTHER): Payer: BC Managed Care – PPO

## 2014-06-29 ENCOUNTER — Ambulatory Visit (INDEPENDENT_AMBULATORY_CARE_PROVIDER_SITE_OTHER): Payer: BC Managed Care – PPO | Admitting: Sports Medicine

## 2014-06-29 DIAGNOSIS — S52511A Displaced fracture of right radial styloid process, initial encounter for closed fracture: Secondary | ICD-10-CM

## 2014-06-29 DIAGNOSIS — S52514D Nondisplaced fracture of right radial styloid process, subsequent encounter for closed fracture with routine healing: Secondary | ICD-10-CM

## 2014-06-29 DIAGNOSIS — S52511D Displaced fracture of right radial styloid process, subsequent encounter for closed fracture with routine healing: Secondary | ICD-10-CM

## 2014-06-29 NOTE — Progress Notes (Signed)
  Subjective: 4 weeks post fracture of the radial styloid process. Doing well, slightly sore.   Objective: General: Well-developed, well-nourished, and in no acute distress. Right wrist: Cast is removed, there is more tenderness over the radiocarpal joint and the trapezium than the radial styloid process itself. Good motion, good strength, neurovascularly intact distally.  Assessment/plan:

## 2014-06-29 NOTE — Assessment & Plan Note (Signed)
Doing well 4 weeks post fracture. Return in 2 weeks, x-ray before visit. Thumb spica brace.

## 2014-07-13 ENCOUNTER — Ambulatory Visit (INDEPENDENT_AMBULATORY_CARE_PROVIDER_SITE_OTHER): Payer: BC Managed Care – PPO

## 2014-07-13 ENCOUNTER — Ambulatory Visit: Payer: BC Managed Care – PPO | Admitting: Sports Medicine

## 2014-07-13 DIAGNOSIS — S52501D Unspecified fracture of the lower end of right radius, subsequent encounter for closed fracture with routine healing: Secondary | ICD-10-CM

## 2014-07-13 DIAGNOSIS — S52511D Displaced fracture of right radial styloid process, subsequent encounter for closed fracture with routine healing: Secondary | ICD-10-CM

## 2014-07-16 ENCOUNTER — Encounter: Payer: Self-pay | Admitting: Sports Medicine

## 2014-07-16 ENCOUNTER — Ambulatory Visit (INDEPENDENT_AMBULATORY_CARE_PROVIDER_SITE_OTHER): Payer: BC Managed Care – PPO | Admitting: Sports Medicine

## 2014-07-16 VITALS — BP 118/76 | HR 80 | Ht 67.0 in | Wt 187.0 lb

## 2014-07-16 DIAGNOSIS — S52511D Displaced fracture of right radial styloid process, subsequent encounter for closed fracture with routine healing: Secondary | ICD-10-CM

## 2014-07-16 NOTE — Assessment & Plan Note (Signed)
Clinically healed, return as needed. 

## 2014-07-16 NOTE — Progress Notes (Signed)
  Subjective: 6 weeks post distal radius styloid fracture, pain-free.   Objective: General: Well-developed, well-nourished, and in no acute distress. Right Wrist: Inspection normal with no visible erythema or swelling. ROM smooth and normal with good flexion and extension and ulnar/radial deviation that is symmetrical with opposite wrist. Palpation is normal over metacarpals, navicular, lunate, and TFCC; tendons without tenderness/ swelling No snuffbox tenderness. No tenderness over Canal of [place]. Strength 5/5 in all directions without pain. Negative Finkelstein, tinel's and phalens. Negative Watson's test.  X-rays no longer show evidence of the fracture line.  Assessment/plan:

## 2014-10-15 ENCOUNTER — Other Ambulatory Visit (HOSPITAL_COMMUNITY): Payer: Self-pay | Admitting: Endocrinology

## 2014-10-15 DIAGNOSIS — C73 Malignant neoplasm of thyroid gland: Secondary | ICD-10-CM

## 2014-11-16 ENCOUNTER — Encounter (HOSPITAL_COMMUNITY)
Admission: RE | Admit: 2014-11-16 | Discharge: 2014-11-16 | Disposition: A | Discharge status: Home / Self Care | Payer: BLUE CROSS/BLUE SHIELD | Source: Ambulatory Visit | Attending: Endocrinology | Admitting: Endocrinology

## 2014-11-16 DIAGNOSIS — C73 Malignant neoplasm of thyroid gland: Secondary | ICD-10-CM | POA: Insufficient documentation

## 2014-11-16 DIAGNOSIS — E039 Hypothyroidism, unspecified: Secondary | ICD-10-CM | POA: Diagnosis present

## 2014-11-16 MED ORDER — THYROTROPIN ALFA 1.1 MG IM SOLR: 0.9000 mg | INTRAMUSCULAR | Status: AC

## 2014-11-17 ENCOUNTER — Encounter (HOSPITAL_COMMUNITY)
Admission: RE | Admit: 2014-11-17 | Discharge: 2014-11-17 | Disposition: A | Discharge status: Home / Self Care | Payer: BLUE CROSS/BLUE SHIELD | Source: Ambulatory Visit | Attending: Endocrinology | Admitting: Endocrinology

## 2014-11-17 DIAGNOSIS — C73 Malignant neoplasm of thyroid gland: Secondary | ICD-10-CM | POA: Diagnosis not present

## 2014-11-17 MED ORDER — THYROTROPIN ALFA 1.1 MG IM SOLR
0.9000 mg | INTRAMUSCULAR | Status: AC
  Administered 2014-11-17: 0.9 mg via INTRAMUSCULAR

## 2014-11-18 ENCOUNTER — Encounter (HOSPITAL_COMMUNITY)
Admission: RE | Admit: 2014-11-18 | Discharge: 2014-11-18 | Disposition: A | Discharge status: Home / Self Care | Payer: BLUE CROSS/BLUE SHIELD | Source: Ambulatory Visit | Attending: Endocrinology | Admitting: Endocrinology

## 2014-11-18 DIAGNOSIS — C73 Malignant neoplasm of thyroid gland: Secondary | ICD-10-CM | POA: Diagnosis not present

## 2014-11-18 LAB — HCG, SERUM, QUALITATIVE: Preg, Serum: NEGATIVE

## 2014-11-18 MED ORDER — SODIUM IODIDE I 131 CAPSULE
4.0000 | Freq: Once | INTRAVENOUS | Status: AC | PRN
  Administered 2014-11-18: 4 via ORAL

## 2014-11-20 ENCOUNTER — Encounter (HOSPITAL_COMMUNITY)
Admission: RE | Admit: 2014-11-20 | Discharge: 2014-11-20 | Disposition: A | Discharge status: Home / Self Care | Payer: BLUE CROSS/BLUE SHIELD | Source: Ambulatory Visit | Attending: Endocrinology | Admitting: Endocrinology

## 2014-11-20 DIAGNOSIS — C73 Malignant neoplasm of thyroid gland: Secondary | ICD-10-CM | POA: Diagnosis not present

## 2014-11-24 MED FILL — Thyrotropin Alfa For Inj 1.1 MG: INTRAMUSCULAR | Qty: 0.9 | Status: AC

## 2015-04-29 ENCOUNTER — Emergency Department (INDEPENDENT_AMBULATORY_CARE_PROVIDER_SITE_OTHER)
Admission: EM | Admit: 2015-04-29 | Discharge: 2015-04-29 | Disposition: A | Discharge status: Home / Self Care | Payer: BLUE CROSS/BLUE SHIELD | Source: Home / Self Care | Attending: Family Medicine | Admitting: Family Medicine

## 2015-04-29 ENCOUNTER — Encounter: Payer: Self-pay | Admitting: *Deleted

## 2015-04-29 DIAGNOSIS — R51 Headache: Secondary | ICD-10-CM

## 2015-04-29 DIAGNOSIS — J069 Acute upper respiratory infection, unspecified: Secondary | ICD-10-CM

## 2015-04-29 DIAGNOSIS — R519 Headache, unspecified: Secondary | ICD-10-CM

## 2015-04-29 DIAGNOSIS — M542 Cervicalgia: Secondary | ICD-10-CM | POA: Diagnosis not present

## 2015-04-29 MED ORDER — AZITHROMYCIN 250 MG PO TABS: 250.0000 mg | ORAL_TABLET | Freq: Every day | ORAL | Status: DC

## 2015-04-29 MED ORDER — BENZONATATE 100 MG PO CAPS: 100.0000 mg | ORAL_CAPSULE | Freq: Three times a day (TID) | ORAL | Status: DC

## 2015-04-29 MED ORDER — FLUTICASONE PROPIONATE 50 MCG/ACT NA SUSP: NASAL | Status: DC

## 2015-04-29 NOTE — ED Notes (Signed)
Pt c/o 2 days of sinus, teeth and jaw pain along with HA and right sided neck pain without injury. Afebrile. Taken Tylenol otc.

## 2015-04-29 NOTE — Discharge Instructions (Signed)
Please wait up to 4 days to see if symptoms start to resolve on their own, if they continue to worsen, you can fill the antibiotic- Azithromycin.  If you decide to take the antibiotic, please take as prescribed and be sure to complete entire course even if you start to feel better to ensure infection does not come back.  You may take 400-600mg  Ibuprofen (Motrin) every 6-8 hours for fever and pain  Alternate with Tylenol  You may take 500mg  Tylenol every 4-6 hours as needed for fever and pain  Follow-up with your primary care provider next week for recheck of symptoms if not improving.  Be sure to drink plenty of fluids and rest, at least 8hrs of sleep a night, preferably more while you are sick. Return urgent care or go to closest ER if you cannot keep down fluids/signs of dehydration, fever not reducing with Tylenol, difficulty breathing/wheezing, stiff neck, worsening condition, or other concerns (see below)

## 2015-04-29 NOTE — ED Provider Notes (Signed)
CSN: [id number]     Arrival date & time 04/29/15  7673 History   First MD Initiated Contact with Patient 04/29/15 [phone]     Chief Complaint  Patient presents with  . Facial Pain  . Neck Pain   (Consider location/radiation/quality/duration/timing/severity/associated sxs/prior Treatment) HPI Pt is a 38yo female presenting to [facility] with c/o Right sided neck pain for 4 days and 2 days of sinus pain.  Right sided neck pain is aching and sore, worse with palpation. Denies known injuries besides a Right shoulder injury about 1 year ago but states she has not had any pain in that area until the last 4 days. Pt c/o frontal headache with facial pain, bilateral ear pain and teeth pain that is aching and sore, 7/10 at worst. Minimal relief with acetaminophen. Pt states it feels like prior sinus infections.  Pt notes her daughter has been sick at home. No recent travel. Denies fever, chills, n/v/d/  Pt use to use Flonase but does not have any more.    Past Medical History  Diagnosis Date  . Thyroid nodule   . Thyroid cancer    Past Surgical History  Procedure Laterality Date  . Tumor excision  01/23/08    spine  . Knee surgery  02/2004  . Total thyroidectomy  05/30/2010   Family History  Problem Relation Age of Onset  . Diabetes Sister   . Hypertension Mother    Social History  Substance Use Topics  . Smoking status: Never Smoker   . Smokeless tobacco: Never Used  . Alcohol Use: No   OB History    No data available     Review of Systems  Constitutional: Negative for fever and chills.  HENT: Positive for congestion, dental problem ( tooth pain), ear pain ( bilateral), rhinorrhea, sinus pressure and sore throat. Negative for trouble swallowing and voice change.   Respiratory: Positive for cough. Negative for shortness of breath.   Cardiovascular: Negative for chest pain and palpitations.  Gastrointestinal: Negative for nausea, vomiting, abdominal pain and diarrhea.  Musculoskeletal: Positive  for myalgias and neck pain ( Right side). Negative for back pain, arthralgias and neck stiffness.  Skin: Negative for rash.  Neurological: Positive for headaches. Negative for dizziness and light-headedness.  All other systems reviewed and are negative.   Allergies  Avelox; Moxifloxacin; Penicillins; and Sulfonamide derivatives  Home Medications   Prior to Admission medications   Medication Sig Start Date End Date Taking? Authorizing Provider  liothyronine (CYTOMEL) 5 MCG tablet Take 5 mcg by mouth daily.   Yes Historical Provider, MD  azithromycin (ZITHROMAX) 250 MG tablet Take 1 tablet (250 mg total) by mouth daily. Take first 2 tablets together, then 1 every day until finished. 04/29/15   Noland Fordyce, PA-C  benzonatate (TESSALON) 100 MG capsule Take 1 capsule (100 mg total) by mouth every 8 (eight) hours. 04/29/15   Noland Fordyce, PA-C  fluticasone (FLONASE) 50 MCG/ACT nasal spray 2 sprays per nostril daily first week, 1 spray per nostril daily. 04/29/15   Noland Fordyce, PA-C  levothyroxine (SYNTHROID, LEVOTHROID) 175 MCG tablet Take 175 mcg by mouth daily before breakfast.    Historical Provider, MD   Meds Ordered and Administered this Visit  Medications - No data to display  BP 116/81 mmHg  Pulse 79  Temp(Src) 98.1 F (36.7 C) (Oral)  Resp 14  Wt 186 lb (84.369 kg)  SpO2 97%  LMP 04/15/2015 No data found.   Physical Exam  Constitutional: She appears well-developed  and well-nourished. No distress.  HENT:  Head: Normocephalic and atraumatic.  Right Ear: Hearing, tympanic membrane, external ear and ear canal normal.  Left Ear: Hearing, tympanic membrane, external ear and ear canal normal.  Nose: Mucosal edema present. Right sinus exhibits maxillary sinus tenderness and frontal sinus tenderness. Left sinus exhibits maxillary sinus tenderness and frontal sinus tenderness.  Mouth/Throat: Uvula is midline, oropharynx is clear and moist and mucous membranes are normal. No trismus  in the jaw. Normal dentition.  Eyes: Conjunctivae are normal. No scleral icterus.  Neck: Normal range of motion. Neck supple. Muscular tenderness present.    No midline spinal tenderness. Tenderness to Right side cervical muscles. No meningeal signs  Cardiovascular: Normal rate, regular rhythm and normal heart sounds.   Pulmonary/Chest: Effort normal and breath sounds normal. No stridor. No respiratory distress. She has no wheezes. She has no rales. She exhibits no tenderness.  Intermittent dry cough. No respiratory distress, able to speak in full sentences w/o difficulty.  Abdominal: Soft. She exhibits no distension and no mass. There is no tenderness. There is no rebound and no guarding.  Musculoskeletal: Normal range of motion.  Lymphadenopathy:    She has cervical adenopathy ( anterior).  Neurological: She is alert.  Skin: Skin is warm and dry. She is not diaphoretic.  Nursing note and vitals reviewed.   ED Course  Procedures (including critical care time)  Labs Review Labs Reviewed - No data to display  Imaging Review No results found.     MDM   1. Acute upper respiratory infection   2. Neck pain on right side   3. Facial pain     Pt with hx of recurrent sinusitis, presenting to [facility] with URI symptoms c/w prior episodes of sinusitis. Sick contact at home.  Pt appears well, non-toxic, is afebrile. No respiratory distress. Intermittent dry cough. Sinus tenderness. Symptoms have been present for 2-4 days, encouraged pt to try ibuprofen, Flonase, fluids, and rest to help with symptoms.  She may start Azithromycin (prescribed due to PCN allergies) in 4-5 days if symptoms not improving, sooner if worsening.  F/u with PCP in 10 days if not improving. Patient verbalized understanding and agreement with treatment plan.     Noland Fordyce, PA-C 04/29/15 [phone]

## 2016-09-19 ENCOUNTER — Ambulatory Visit (INDEPENDENT_AMBULATORY_CARE_PROVIDER_SITE_OTHER): Payer: BLUE CROSS/BLUE SHIELD | Admitting: Sports Medicine

## 2016-09-19 ENCOUNTER — Ambulatory Visit (INDEPENDENT_AMBULATORY_CARE_PROVIDER_SITE_OTHER): Payer: BLUE CROSS/BLUE SHIELD

## 2016-09-19 ENCOUNTER — Encounter: Payer: Self-pay | Admitting: Sports Medicine

## 2016-09-19 DIAGNOSIS — M7582 Other shoulder lesions, left shoulder: Secondary | ICD-10-CM | POA: Diagnosis not present

## 2016-09-19 DIAGNOSIS — M25512 Pain in left shoulder: Secondary | ICD-10-CM

## 2016-09-19 MED ORDER — MELOXICAM 15 MG PO TABS: ORAL_TABLET | ORAL | 3 refills | Status: DC

## 2016-09-19 NOTE — Progress Notes (Signed)
   Subjective:    I'm seeing this patient as a consultation for:  Dr. Tommy Medal  CC: Left shoulder pain  HPI: For several months this pleasant 41 year old female has had pain that she localizes over the left deltoid, worse with overhead activities, reaching activities. No trauma, no constitutional symptoms, no radiation past the elbow. Moderate, persistent.  Past medical history:  Negative.  See flowsheet/record as well for more information.  Surgical history: Negative.  See flowsheet/record as well for more information.  Family history: Negative.  See flowsheet/record as well for more information.  Social history: Negative.  See flowsheet/record as well for more information.  Allergies, and medications have been entered into the medical record, reviewed, and no changes needed.   Review of Systems: No headache, visual changes, nausea, vomiting, diarrhea, constipation, dizziness, abdominal pain, skin rash, fevers, chills, night sweats, weight loss, swollen lymph nodes, body aches, joint swelling, muscle aches, chest pain, shortness of breath, mood changes, visual or auditory hallucinations.   Objective:   General: Well Developed, well nourished, and in no acute distress.  Neuro/Psych: Alert and oriented x3, extra-ocular muscles intact, able to move all 4 extremities, sensation grossly intact. Skin: Warm and dry, no rashes noted.  Respiratory: Not using accessory muscles, speaking in full sentences, trachea midline.  Cardiovascular: Pulses palpable, no extremity edema. Abdomen: Does not appear distended. Left Shoulder: Inspection reveals no abnormalities, atrophy or asymmetry. Palpation is normal with no tenderness over AC joint or bicipital groove. ROM is full in all planes. Rotator cuff strength weak to external rotation with reproduction of pain on resisted external rotation. Positive Neer and Hawkin's tests, empty can. Speeds and Yergason's tests normal. No labral pathology noted  with negative Obrien's, negative crank, negative clunk, and good stability. Normal scapular function observed. No painful arc and no drop arm sign. No apprehension sign  Impression and Recommendations:   This case required medical decision making of moderate complexity.  Infraspinatus tendinitis, left X-rays, meloxicam formal physical therapy. Return in one month , injection if no better.

## 2016-09-19 NOTE — Assessment & Plan Note (Signed)
X-rays, meloxicam formal physical therapy. Return in one month , injection if no better.

## 2016-09-28 ENCOUNTER — Encounter: Payer: Self-pay | Admitting: Rehabilitative and Restorative Service Providers"

## 2016-09-28 ENCOUNTER — Ambulatory Visit (INDEPENDENT_AMBULATORY_CARE_PROVIDER_SITE_OTHER): Payer: BLUE CROSS/BLUE SHIELD | Admitting: Rehabilitative and Restorative Service Providers"

## 2016-09-28 DIAGNOSIS — M25512 Pain in left shoulder: Secondary | ICD-10-CM | POA: Diagnosis not present

## 2016-09-28 DIAGNOSIS — R293 Abnormal posture: Secondary | ICD-10-CM | POA: Diagnosis not present

## 2016-09-28 NOTE — Patient Instructions (Addendum)
Self massage using a 4 inch plastic ball  Shoulder Blade Squeeze    Rotate shoulders back, then squeeze shoulder blades down and back Hold 10 sec Repeat _10___ times. Do ___several _ sessions per day.    SUPINE Tips A    Being in the supine position means to be lying on the back. Lying on the back is the position of least compression on the bones and discs of the spine, and helps to re-align the natural curves of the back. Arms at side about 90 degrees - working gradually as you can to 120 deg  Hold 3-5 min  Can bend elbows to release stretch for a few seconds as needed    Scapula Adduction With Pectoralis Stretch: Low - Standing   Shoulders at 45 hands even with shoulders, keeping weight through legs, shift weight forward until you feel pull or stretch through the front of your chest. Hold _30__ seconds. Do _3__ times, _2-4__ times per day.   Scapula Adduction With Pectoralis Stretch: Mid-Range - Standing   Shoulders at 90 elbows even with shoulders, keeping weight through legs, shift weight forward until you feel pull or strength through the front of your chest. Hold __30_ seconds. Do _3__ times, __2-4_ times per day.   Scapula Adduction With Pectoralis Stretch: High - Standing   Shoulders at 120 hands up high on the doorway, keeping weight on feet, shift weight forward until you feel pull or stretch through the front of your chest. Hold _30__ seconds. Do _3__ times, _2-3__ times per day.  TENS UNIT: This is helpful for muscle pain and spasm.   Search and Purchase a TENS 7000 2nd edition at www.tenspros.com. It should be less than $30.     TENS unit instructions: Do not shower or bathe with the unit on Turn the unit off before removing electrodes or batteries If the electrodes lose stickiness add a drop of water to the electrodes after they are disconnected from the unit and place on plastic sheet. If you continued to have difficulty, call the TENS unit company  to purchase more electrodes. Do not apply lotion on the skin area prior to use. Make sure the skin is clean and dry as this will help prolong the life of the electrodes. After use, always check skin for unusual red areas, rash or other skin difficulties. If there are any skin problems, does not apply electrodes to the same area. Never remove the electrodes from the unit by pulling the wires. Do not use the TENS unit or electrodes other than as directed. Do not change electrode placement without consultating your therapist or physician. Keep 2 fingers with between each electrode.

## 2016-09-28 NOTE — Therapy (Signed)
[place], [place], [address] Phone: [phone]   Fax:  [phone]  Physical Therapy Evaluation  Patient Details  Name: Alice Mccann MRN: [id number] Date of Birth: [birth year] Referring Provider: Dr Dianah Field  Encounter Date: 09/28/2016      PT End of Session - 09/28/16 1233    Visit Number 1   Number of Visits 8   Date for PT Re-Evaluation 11/23/16   PT Start Time 1016   PT Stop Time 1114   PT Time Calculation (min) 58 min   Activity Tolerance Patient tolerated treatment well      Past Medical History:  Diagnosis Date  . Thyroid cancer ([place])   . Thyroid nodule     Past Surgical History:  Procedure Laterality Date  . KNEE SURGERY  02/2004  . TOTAL THYROIDECTOMY  05/30/2010  . TUMOR EXCISION  01/23/08   spine    There were no vitals filed for this visit.       Subjective Assessment - 09/28/16 1022    Subjective Patient reports that she noticed pain in the Lt shoudler 11/17. She has pain and tightness in the shoudler with certain movements. Symptoms have not improved - she has poppingand pain with movement. she has pain throughout the day sometimes radiating into the Lt elbow.  Patient reports that she has to pay for PT out of pocket due to large deductable before she has insurance coverage. She would like to come for PT only one time per week and progress to independent HEP ASAP.    Pertinent History denies musculoskeletal problems    How long can you sit comfortably? no limit   How long can you stand comfortably? no limit   How long can you walk comfortably? no limit   Diagnostic tests xrays    Patient Stated Goals to make shoudler feel better    Currently in Pain? Yes   Pain Score 4    Pain Location Shoulder   Pain Orientation Left   Pain Descriptors / Indicators Dull;Nagging   Pain Type Acute pain   Pain Radiating Towards into elbow at times    Pain Onset More than a  month ago   Pain Frequency Constant   Aggravating Factors  lying on the Lt side; lifting arm; liftingobjects; holding items with Lt UE   Pain Relieving Factors meds help some            [facility] PT Assessment - 09/28/16 0001      Assessment   Medical Diagnosis Lt shoulder pain/dysfunction   Referring Provider Dr Dianah Field   Onset Date/Surgical Date 06/28/16   Hand Dominance Right   Next MD Visit 10/31/16   Prior Therapy no     Precautions   Precautions None     Balance Screen   Has the patient fallen in the past 6 months No   Has the patient had a decrease in activity level because of a fear of falling?  No   Is the patient reluctant to leave their home because of a fear of falling?  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 when the shoudler is feeling better    Leisure household chores; walking 1 x wk ~ 30 min      Sensation   Additional Comments  WNL's per pt report      Posture/Postural Control   Posture Comments significant head forward; shoudlers rounded posture; scapulae abducted and rotated along the thoracic wall; head of the humerus anterior in orientation      AROM   Right Shoulder Extension 68 Degrees   Right Shoulder Flexion 151 Degrees   Right Shoulder ABduction 172 Degrees   Right Shoulder Internal Rotation 30 Degrees   Right Shoulder External Rotation 92 Degrees   Left Shoulder Extension 53 Degrees   Left Shoulder Flexion 138 Degrees   Left Shoulder ABduction 120 Degrees   Left Shoulder Internal Rotation 21 Degrees   Left Shoulder External Rotation 80 Degrees   Cervical Flexion 61   Cervical Extension 38   Cervical - Right Side Bend 41   Cervical - Left Side Bend 38   Cervical - Right Rotation 68   Cervical - Left Rotation 71     Strength   Right Shoulder Flexion 5/5   Right Shoulder Extension 5/5   Right  Shoulder ABduction 5/5   Right Shoulder Internal Rotation 5/5   Right Shoulder External Rotation 5/5   Left Shoulder Flexion 5/5   Left Shoulder Extension 5/5   Left Shoulder ABduction 5/5   Left Shoulder Internal Rotation --  5-/5 some pain    Left Shoulder External Rotation 5/5  discomfort      Palpation   Palpation comment tightness and pain with palpation anterior shoudler; pecs; upper trap; leveator; teres Lt                    [facility] Adult PT Treatment/Exercise - 09/28/16 0001      Therapeutic Activites    Therapeutic Activities --  instructed in myofacial ball release work      Neuro Re-ed    Neuro Re-ed Details  working on posture and alignment engaging posterior shoulder girdle      Shoulder Exercises: Supine   Other Supine Exercises snow angle arms ~ 80 deg x 3 min - on noodle for ~ 2 min arms at ~40 deg      Shoulder Exercises: Standing   Other Standing Exercises scap squeeze with noodle 10 sec x 10 reps      Shoulder Exercises: Stretch   Other Shoulder Stretches doorway stretch lower position 30 sec x 3      Moist Heat Therapy   Number Minutes Moist Heat 20 Minutes   Moist Heat Location Shoulder  Lt     Electrical Stimulation   Electrical Stimulation Location Lt shoulder    Electrical Stimulation Action IFC   Electrical Stimulation Parameters to tolerance   Electrical Stimulation Goals Pain;Tone                PT Education - 09/28/16 1050    Education provided Yes   Education Details HEP; TENs unit    Person(s) Educated Patient   Methods Explanation;Demonstration;Tactile cues;Verbal cues;Handout   Comprehension Verbalized understanding;Returned demonstration;Verbal cues required;Tactile cues required          PT Short Term Goals - 09/28/16 1237      PT SHORT TERM GOAL #1   Title Patient will be independent in initial HEP 10/12/16   Time 2   Period Weeks   Status New     PT SHORT TERM GOAL #2   Title Full AROM Lt shoulder  10/19/16   Time 3   Period Weeks   Status New  PT Long Term Goals - 09/28/16 1239      PT LONG TERM GOAL #1   Title 5/5 strength Lt shoudler with no pain with resistive testing 11/23/16   Time 8   Period Weeks   Status New     PT LONG TERM GOAL #2   Title Patient reports that she is using Lt UE with no pain for functional activities 11/23/16   Time 8   Period Weeks   Status New     PT LONG TERM GOAL #3   Title Improve posture and alignment with patient to demonstrate good posture and alignment with control of the posterior shoudler girdle 11/23/16   Time 8   Period Weeks   Status New     PT LONG TERM GOAL #4   Title Independent in HEP 11/23/16   Time 8   Period Weeks   Status New               Plan - 09/28/16 1234    Clinical Impression Statement Alice Mccann presents with Lt shoulder pain and dysfunction. She has poor posture and alignment; limited AROM Lt shoudler; pain with resistive testing Lt shoudler; pain and muscular tightness with palpation; limited functional activities Lt UE; pain on a daily basis. Patient will benefit form PT to address problems identified.    Rehab Potential Good   PT Frequency 1x / week   PT Duration 8 weeks   PT Treatment/Interventions Patient/family education;ADLs/Self Care Home Management;Cryotherapy;Electrical Stimulation;Iontophoresis 4mg /ml Dexamethasone;Moist Heat;Ultrasound;Dry needling;Manual techniques;Therapeutic activities;Therapeutic exercise   PT Next Visit Plan postural correction; thoracic extension; AROM; stretching; posterior shoulder girdle strengthening; manual work; possibly DN; modalities as indicated    Consulted and Agree with Plan of Care Patient      Patient will benefit from skilled therapeutic intervention in order to improve the following deficits and impairments:  Postural dysfunction, Improper body mechanics, Pain, Impaired UE functional use, Increased fascial restricitons, Increased muscle spasms,  Decreased mobility, Decreased range of motion, Decreased strength, Decreased activity tolerance  Visit Diagnosis: Acute pain of left shoulder - Plan: PT plan of care cert/re-cert  Abnormal posture - Plan: PT plan of care cert/re-cert     Problem List Patient Active Problem List   Diagnosis Date Noted  . Infraspinatus tendinitis, left 09/19/2016  . Closed fracture of radial styloid 06/01/2014  . Thyroid cancer, follicular variant of papillary carcinoma, multifocal, T3, N0, Mx 08/28/2011  . URI 01/15/2009    Violette Morneault Nilda Simmer PT, MPH  09/28/2016, 12:48 PM  [facility] [place], [place], [address] Phone: [phone]   Fax:  [phone]  Name: Alice Mccann MRN: [id number] Date of Birth: Apr 23, 1976

## 2016-10-05 ENCOUNTER — Ambulatory Visit (INDEPENDENT_AMBULATORY_CARE_PROVIDER_SITE_OTHER): Payer: BLUE CROSS/BLUE SHIELD | Admitting: Physical Therapy

## 2016-10-05 DIAGNOSIS — M25512 Pain in left shoulder: Secondary | ICD-10-CM

## 2016-10-05 DIAGNOSIS — R293 Abnormal posture: Secondary | ICD-10-CM

## 2016-10-05 NOTE — Therapy (Addendum)
[place], [place], [address] Phone: [phone]   Fax:  [phone]  Physical Therapy Treatment  Patient Details  Name: Alice Mccann MRN: [id number] Date of Birth: 05/26/1976 Referring Provider: Dr Dianah Field  Encounter Date: 10/05/2016      PT End of Session - 10/05/16 1015    Visit Number 2   Number of Visits 8   Date for PT Re-Evaluation 11/23/16   PT Start Time 0932   PT Stop Time 1029   PT Time Calculation (min) 57 min   Activity Tolerance Patient tolerated treatment well   Behavior During Therapy [facility] for tasks assessed/performed      Past Medical History:  Diagnosis Date  . Thyroid cancer ([place])   . Thyroid nodule     Past Surgical History:  Procedure Laterality Date  . KNEE SURGERY  02/2004  . TOTAL THYROIDECTOMY  05/30/2010  . TUMOR EXCISION  01/23/08   spine    There were no vitals filed for this visit.      Subjective Assessment - 10/05/16 0934    Subjective Lt shoulder is feeling a little better, doesn't feel as tight.  Still has some pain with certain movements, but better.   Pertinent History denies musculoskeletal problems    Patient Stated Goals to make shoudler feel better    Currently in Pain? No/denies            [facility] PT Assessment - 10/05/16 1441      Observation/Other Assessments   Focus on Therapeutic Outcomes (FOTO)  57 (43% limited; predicted 28% limited)                     [facility] Adult PT Treatment/Exercise - 10/05/16 0935      Shoulder Exercises: Supine   Horizontal ABduction Both;15 reps;Theraband   Theraband Level (Shoulder Horizontal ABduction) Level 1 (Yellow)   External Rotation Both;15 reps;Theraband   Theraband Level (Shoulder External Rotation) Level 1 (Yellow)   Flexion Left;15 reps;Theraband   Theraband Level (Shoulder Flexion) Level 1 (Yellow)   Other Supine Exercises snow angle arms ~ 80 deg x 3 min - on noodle  for ~ 2 min arms at ~40 deg      Shoulder Exercises: Standing   External Rotation Left;15 reps;Theraband   Theraband Level (Shoulder External Rotation) Level 1 (Yellow)   ABduction Left;15 reps;Theraband   Theraband Level (Shoulder ABduction) Level 1 (Yellow)   ABduction Limitations to 80-85 deg   Extension Both;15 reps;Theraband   Theraband Level (Shoulder Extension) Level 2 (Red)   Retraction Both;15 reps;Theraband   Theraband Level (Shoulder Retraction) Level 2 (Red)     Shoulder Exercises: ROM/Strengthening   UBE (Upper Arm Bike) L2 standing x 4 min; 2' fwd/2' bwd     Shoulder Exercises: Stretch   Other Shoulder Stretches doorway stretch lower position 30 sec x 3      Moist Heat Therapy   Number Minutes Moist Heat 15 Minutes   Moist Heat Location Shoulder     Electrical Stimulation   Electrical Stimulation Location Lt shoulder    Electrical Stimulation Action IFC (TENS)   Electrical Stimulation Parameters to tolerance   Electrical Stimulation Goals Pain;Tone     Manual Therapy   Manual Therapy Myofascial release;Soft tissue mobilization   Soft tissue mobilization L UT in supine   Myofascial Release L UT in supine  PT Short Term Goals - 09/28/16 1237      PT SHORT TERM GOAL #1   Title Patient will be independent in initial HEP 10/12/16   Time 2   Period Weeks   Status New     PT SHORT TERM GOAL #2   Title Full AROM Lt shoulder 10/19/16   Time 3   Period Weeks   Status New           PT Long Term Goals - 09/28/16 1239      PT LONG TERM GOAL #1   Title 5/5 strength Lt shoudler with no pain with resistive testing 11/23/16   Time 8   Period Weeks   Status New     PT LONG TERM GOAL #2   Title Patient reports that she is using Lt UE with no pain for functional activities 11/23/16   Time 8   Period Weeks   Status New     PT LONG TERM GOAL #3   Title Improve posture and alignment with patient to demonstrate good posture and alignment  with control of the posterior shoudler girdle 11/23/16   Time 8   Period Weeks   Status New     PT LONG TERM GOAL #4   Title Independent in HEP 11/23/16   Time 8   Period Weeks   Status New               Plan - 10/05/16 1015    Clinical Impression Statement Pt reports shoulder is improving, still having pain specifically with abduction but tolerated session well today.  Session focused on strengthening with theraband, did not add to HEP at this time.  Want to see how pt responds to session today and will add to HEP if tolerated well.  Will continue to benefit from PT to maximize function.   PT Treatment/Interventions Patient/family education;ADLs/Self Care Home Management;Cryotherapy;Electrical Stimulation;Iontophoresis 7m/ml Dexamethasone;Moist Heat;Ultrasound;Dry needling;Manual techniques;Therapeutic activities;Therapeutic exercise   PT Next Visit Plan postural correction; thoracic extension; AROM; stretching; posterior shoulder girdle strengthening; manual work; possibly DN; modalities as indicated    Consulted and Agree with Plan of Care Patient      Patient will benefit from skilled therapeutic intervention in order to improve the following deficits and impairments:  Postural dysfunction, Improper body mechanics, Pain, Impaired UE functional use, Increased fascial restricitons, Increased muscle spasms, Decreased mobility, Decreased range of motion, Decreased strength, Decreased activity tolerance  Visit Diagnosis: Acute pain of left shoulder  Abnormal posture     Problem List Patient Active Problem List   Diagnosis Date Noted  . Infraspinatus tendinitis, left 09/19/2016  . Closed fracture of radial styloid 06/01/2014  . Thyroid cancer, follicular variant of papillary carcinoma, multifocal, T3, N0, Mx 08/28/2011  . URI 01/15/2009      SLaureen Abrahams PT, DPT 10/05/16 2:42 PM    [facility] [address]Phone: [phone]  Fax:  [phone] Name: STAELER WINNINGMRN: [id number] of Birth: 502/11/1975 PHYSICAL THERAPY DISCHARGE SUMMARY  Visits from Start of Care: 2  Current functional level related to goals / functional outcomes: See last progress note for discharge status   Remaining deficits: unknown   Education / Equipment: HEP Plan: Patient agrees to discharge.  Patient goals were partially met. Patient is being discharged due to not returning since the last visit.  ?????     Celyn P. HHelene KelpPT, MPH 12/15/16 9:58  AM   

## 2016-10-13 ENCOUNTER — Encounter: Payer: BLUE CROSS/BLUE SHIELD | Admitting: Physical Therapy

## 2016-10-31 ENCOUNTER — Ambulatory Visit: Payer: BLUE CROSS/BLUE SHIELD | Admitting: Sports Medicine

## 2017-06-19 DIAGNOSIS — M79644 Pain in right finger(s): Secondary | ICD-10-CM | POA: Diagnosis not present

## 2017-06-19 DIAGNOSIS — M79641 Pain in right hand: Secondary | ICD-10-CM | POA: Diagnosis not present

## 2017-06-19 DIAGNOSIS — M79645 Pain in left finger(s): Secondary | ICD-10-CM | POA: Diagnosis not present

## 2017-06-19 DIAGNOSIS — M79642 Pain in left hand: Secondary | ICD-10-CM | POA: Diagnosis not present

## 2017-08-04 ENCOUNTER — Other Ambulatory Visit: Payer: Self-pay

## 2017-08-04 ENCOUNTER — Encounter: Payer: Self-pay | Admitting: Emergency Medicine

## 2017-08-04 ENCOUNTER — Emergency Department (INDEPENDENT_AMBULATORY_CARE_PROVIDER_SITE_OTHER)
Admission: EM | Admit: 2017-08-04 | Discharge: 2017-08-04 | Disposition: A | Discharge status: Home / Self Care | Payer: BLUE CROSS/BLUE SHIELD | Source: Home / Self Care | Attending: Emergency Medicine | Admitting: Emergency Medicine

## 2017-08-04 DIAGNOSIS — R059 Cough, unspecified: Secondary | ICD-10-CM

## 2017-08-04 DIAGNOSIS — J0101 Acute recurrent maxillary sinusitis: Secondary | ICD-10-CM | POA: Diagnosis not present

## 2017-08-04 DIAGNOSIS — R05 Cough: Secondary | ICD-10-CM

## 2017-08-04 MED ORDER — AZITHROMYCIN 250 MG PO TABS: ORAL_TABLET | ORAL | 0 refills | Status: DC

## 2017-08-04 MED ORDER — BENZONATATE 100 MG PO CAPS: 100.0000 mg | ORAL_CAPSULE | Freq: Three times a day (TID) | ORAL | 0 refills | Status: DC | PRN

## 2017-08-04 NOTE — ED Triage Notes (Signed)
Cough & Congestion, Sinus Headache x 6 days

## 2017-08-04 NOTE — ED Provider Notes (Addendum)
[facility]    CSN: [id number] Arrival date & time: 08/04/17  0904     History   Chief Complaint Chief Complaint  Patient presents with  . Cough    HPI UBAH RADKE is a 41 y.o. female.  Patient presents with a a six-day history of what initially started as sore throat and ear pain and progressed to significant sinus congestion and cough. Her son was ill 10 days ago and she feels she may have caught something from him. She denies any fever. She is not a smoker. She has a sinus infection about one time a month. She has not been taking any medication for this. HPI  Past Medical History:  Diagnosis Date  . Thyroid cancer ([place])   . Thyroid nodule     Patient Active Problem List   Diagnosis Date Noted  . Infraspinatus tendinitis, left 09/19/2016  . Closed fracture of radial styloid 06/01/2014  . Thyroid cancer, follicular variant of papillary carcinoma, multifocal, T3, N0, Mx 08/28/2011  . URI 01/15/2009    Past Surgical History:  Procedure Laterality Date  . KNEE SURGERY  02/2004  . TOTAL THYROIDECTOMY  05/30/2010  . TUMOR EXCISION  01/23/08   spine    OB History    No data available       Home Medications    Prior to Admission medications   Medication Sig Start Date End Date Taking? Authorizing Provider  levothyroxine (SYNTHROID, LEVOTHROID) 175 MCG tablet Take 175 mcg by mouth daily before breakfast.   Yes [provider]  liothyronine (CYTOMEL) 5 MCG tablet Take 5 mcg by mouth daily.   Yes [provider]  azithromycin (ZITHROMAX) 250 MG tablet Take 2 tabs PO x 1 dose, then 1 tab PO QD x 4 days 08/04/17   Darlyne Russian, MD  benzonatate (TESSALON) 100 MG capsule Take 1-2 capsules (100-200 mg total) by mouth 3 (three) times daily as needed for cough. 08/04/17   Darlyne Russian, MD    Family History Family History  Problem Relation Age of Onset  . Hypertension Mother   . Diabetes Sister     Social History Social History     Tobacco Use  . Smoking status: Never Smoker  . Smokeless tobacco: Never Used  Substance Use Topics  . Alcohol use: No  . Drug use: No     Allergies   Avelox [moxifloxacin hcl in nacl]; Moxifloxacin; Penicillins; and Sulfonamide derivatives   Review of Systems Review of Systems  Constitutional: Positive for fatigue. Negative for fever.  HENT: Positive for congestion, ear pain, rhinorrhea, sinus pressure, sinus pain and sore throat.   Eyes: Negative for discharge.  Respiratory: Positive for cough. Negative for shortness of breath and wheezing.   Gastrointestinal: Negative.      Physical Exam Triage Vital Signs ED Triage Vitals  Enc Vitals Group     BP 08/04/17 0917 120/77     Pulse Rate 08/04/17 0917 90     Resp --      Temp 08/04/17 0917 98.4 F (36.9 C)     Temp Source 08/04/17 0917 Oral     SpO2 08/04/17 0917 98 %     Weight 08/04/17 0918 157 lb (71.2 kg)     Height 08/04/17 0918 5\' 6"  (1.676 m)     Head Circumference --      Peak Flow --      Pain Score 08/04/17 0918 4     Pain Loc --  Pain Edu? --      Excl. in Trail? --    No data found.  Updated Vital Signs BP 120/77 (BP Location: Left Arm)   Pulse 90   Temp 98.4 F (36.9 C) (Oral)   Ht 5\' 6"  (1.676 m)   Wt 157 lb (71.2 kg)   SpO2 98%   BMI 25.34 kg/m   Visual Acuity Right Eye Distance:   Left Eye Distance:   Bilateral Distance:    Right Eye Near:   Left Eye Near:    Bilateral Near:     Physical Exam  Constitutional: She appears well-developed and well-nourished.  HENT:  Right Ear: External ear normal.  Left Ear: External ear normal.  Mouth/Throat: Oropharynx is clear and moist. No oropharyngeal exudate.  There is nasal congestion noted no purulence.  Neck: Normal range of motion. No tracheal deviation present. No thyromegaly present.  Cardiovascular: Normal rate and normal heart sounds.  Pulmonary/Chest: Effort normal and breath sounds normal. No respiratory distress. She has no  wheezes. She has no rales.  Abdominal: Soft.     UC Treatments / Results  Labs (all labs ordered are listed, but only abnormal results are displayed) Labs Reviewed - No data to display  EKG  EKG Interpretation None       Radiology No results found.  Procedures Procedures (including critical care time)  Medications Ordered in UC Medications - No data to display   Initial Impression / Assessment and Plan / UC Course  I have reviewed the triage vital signs and the nursing notes.  Pertinent labs & imaging results that were available during my care of the patient were reviewed by me and considered in my medical decision making (see chart for details). This patient has been ill for 7 days. She has a thick green nasal drainage and productive cough at times. She has taken Zithromax in the past with good results. Prescription written for Zithromax as well as Tessalon Perles to help with symptoms.      Final Clinical Impressions(s) / UC Diagnoses   Final diagnoses:  Acute recurrent maxillary sinusitis  Cough    ED Discharge Orders        Ordered    azithromycin (ZITHROMAX) 250 MG tablet     08/04/17 0932    benzonatate (TESSALON) 100 MG capsule  3 times daily PRN     08/04/17 0932       Controlled Substance Prescriptions [place] Controlled Substance Registry consulted? Not Applicable   Darlyne Russian, MD 08/04/17 8502    Darlyne Russian, MD 08/04/17 7741    Darlyne Russian, MD 08/04/17 [phone]

## 2017-08-04 NOTE — Discharge Instructions (Signed)
Use saline rinse every 2-3 hours to clear your sinus. You can also get in the shower and breathing in the [place]. Take antibiotics as prescribed. Use Tessalon Perles as needed for cough.

## 2017-09-25 DIAGNOSIS — Z6825 Body mass index (BMI) 25.0-25.9, adult: Secondary | ICD-10-CM | POA: Diagnosis not present

## 2017-09-25 DIAGNOSIS — Z01419 Encounter for gynecological examination (general) (routine) without abnormal findings: Secondary | ICD-10-CM | POA: Diagnosis not present

## 2017-10-02 DIAGNOSIS — Z1231 Encounter for screening mammogram for malignant neoplasm of breast: Secondary | ICD-10-CM | POA: Diagnosis not present

## 2017-10-15 DIAGNOSIS — E559 Vitamin D deficiency, unspecified: Secondary | ICD-10-CM | POA: Diagnosis not present

## 2017-10-15 DIAGNOSIS — E890 Postprocedural hypothyroidism: Secondary | ICD-10-CM | POA: Diagnosis not present

## 2017-10-15 DIAGNOSIS — C73 Malignant neoplasm of thyroid gland: Secondary | ICD-10-CM | POA: Diagnosis not present

## 2017-10-22 DIAGNOSIS — E890 Postprocedural hypothyroidism: Secondary | ICD-10-CM | POA: Diagnosis not present

## 2017-10-22 DIAGNOSIS — E039 Hypothyroidism, unspecified: Secondary | ICD-10-CM | POA: Diagnosis not present

## 2017-10-22 DIAGNOSIS — C73 Malignant neoplasm of thyroid gland: Secondary | ICD-10-CM | POA: Diagnosis not present

## 2017-10-22 DIAGNOSIS — E559 Vitamin D deficiency, unspecified: Secondary | ICD-10-CM | POA: Diagnosis not present

## 2017-11-22 DIAGNOSIS — Z0000 Encounter for general adult medical examination without abnormal findings: Secondary | ICD-10-CM | POA: Diagnosis not present

## 2017-11-22 DIAGNOSIS — C73 Malignant neoplasm of thyroid gland: Secondary | ICD-10-CM | POA: Diagnosis not present

## 2017-11-22 DIAGNOSIS — E890 Postprocedural hypothyroidism: Secondary | ICD-10-CM | POA: Diagnosis not present

## 2017-11-22 DIAGNOSIS — E559 Vitamin D deficiency, unspecified: Secondary | ICD-10-CM | POA: Diagnosis not present

## 2018-08-15 DIAGNOSIS — B349 Viral infection, unspecified: Secondary | ICD-10-CM | POA: Diagnosis not present

## 2018-08-15 DIAGNOSIS — R509 Fever, unspecified: Secondary | ICD-10-CM | POA: Diagnosis not present

## 2018-10-03 DIAGNOSIS — Z1231 Encounter for screening mammogram for malignant neoplasm of breast: Secondary | ICD-10-CM | POA: Diagnosis not present

## 2018-10-22 DIAGNOSIS — C73 Malignant neoplasm of thyroid gland: Secondary | ICD-10-CM | POA: Diagnosis not present

## 2018-10-22 DIAGNOSIS — E039 Hypothyroidism, unspecified: Secondary | ICD-10-CM | POA: Diagnosis not present

## 2018-10-22 DIAGNOSIS — E559 Vitamin D deficiency, unspecified: Secondary | ICD-10-CM | POA: Diagnosis not present

## 2018-10-29 DIAGNOSIS — E890 Postprocedural hypothyroidism: Secondary | ICD-10-CM | POA: Diagnosis not present

## 2018-10-29 DIAGNOSIS — C73 Malignant neoplasm of thyroid gland: Secondary | ICD-10-CM | POA: Diagnosis not present

## 2018-12-31 DIAGNOSIS — E890 Postprocedural hypothyroidism: Secondary | ICD-10-CM | POA: Diagnosis not present

## 2018-12-31 DIAGNOSIS — E559 Vitamin D deficiency, unspecified: Secondary | ICD-10-CM | POA: Diagnosis not present

## 2019-01-09 DIAGNOSIS — Z03818 Encounter for observation for suspected exposure to other biological agents ruled out: Secondary | ICD-10-CM | POA: Diagnosis not present

## 2019-01-13 DIAGNOSIS — Z0000 Encounter for general adult medical examination without abnormal findings: Secondary | ICD-10-CM | POA: Diagnosis not present

## 2019-01-20 DIAGNOSIS — Z0000 Encounter for general adult medical examination without abnormal findings: Secondary | ICD-10-CM | POA: Diagnosis not present

## 2019-01-20 DIAGNOSIS — Z23 Encounter for immunization: Secondary | ICD-10-CM | POA: Diagnosis not present

## 2019-01-21 DIAGNOSIS — Z01419 Encounter for gynecological examination (general) (routine) without abnormal findings: Secondary | ICD-10-CM | POA: Diagnosis not present

## 2019-01-21 DIAGNOSIS — Z6825 Body mass index (BMI) 25.0-25.9, adult: Secondary | ICD-10-CM | POA: Diagnosis not present

## 2019-06-29 DIAGNOSIS — Z20828 Contact with and (suspected) exposure to other viral communicable diseases: Secondary | ICD-10-CM | POA: Diagnosis not present

## 2019-07-29 DIAGNOSIS — E890 Postprocedural hypothyroidism: Secondary | ICD-10-CM | POA: Diagnosis not present

## 2019-10-06 DIAGNOSIS — Z1231 Encounter for screening mammogram for malignant neoplasm of breast: Secondary | ICD-10-CM | POA: Diagnosis not present

## 2019-10-18 DIAGNOSIS — Z20828 Contact with and (suspected) exposure to other viral communicable diseases: Secondary | ICD-10-CM | POA: Diagnosis not present

## 2019-10-18 DIAGNOSIS — Z03818 Encounter for observation for suspected exposure to other biological agents ruled out: Secondary | ICD-10-CM | POA: Diagnosis not present

## 2019-10-28 DIAGNOSIS — Z20828 Contact with and (suspected) exposure to other viral communicable diseases: Secondary | ICD-10-CM | POA: Diagnosis not present

## 2019-10-28 DIAGNOSIS — Z03818 Encounter for observation for suspected exposure to other biological agents ruled out: Secondary | ICD-10-CM | POA: Diagnosis not present

## 2019-11-04 DIAGNOSIS — Z20828 Contact with and (suspected) exposure to other viral communicable diseases: Secondary | ICD-10-CM | POA: Diagnosis not present

## 2019-11-04 DIAGNOSIS — U071 COVID-19: Secondary | ICD-10-CM | POA: Diagnosis not present

## 2019-11-17 DIAGNOSIS — E890 Postprocedural hypothyroidism: Secondary | ICD-10-CM | POA: Diagnosis not present

## 2019-11-17 DIAGNOSIS — E559 Vitamin D deficiency, unspecified: Secondary | ICD-10-CM | POA: Diagnosis not present

## 2019-11-17 DIAGNOSIS — C73 Malignant neoplasm of thyroid gland: Secondary | ICD-10-CM | POA: Diagnosis not present

## 2019-12-22 DIAGNOSIS — C73 Malignant neoplasm of thyroid gland: Secondary | ICD-10-CM | POA: Diagnosis not present

## 2019-12-22 DIAGNOSIS — E890 Postprocedural hypothyroidism: Secondary | ICD-10-CM | POA: Diagnosis not present

## 2019-12-23 ENCOUNTER — Other Ambulatory Visit: Payer: Self-pay | Admitting: Endocrinology

## 2019-12-23 DIAGNOSIS — C73 Malignant neoplasm of thyroid gland: Secondary | ICD-10-CM

## 2020-01-02 ENCOUNTER — Ambulatory Visit
Admission: RE | Admit: 2020-01-02 | Discharge: 2020-01-02 | Disposition: A | Discharge status: Home / Self Care | Payer: BC Managed Care – PPO | Source: Ambulatory Visit | Attending: Endocrinology | Admitting: Endocrinology

## 2020-01-02 DIAGNOSIS — C73 Malignant neoplasm of thyroid gland: Secondary | ICD-10-CM

## 2020-01-02 DIAGNOSIS — Z0000 Encounter for general adult medical examination without abnormal findings: Secondary | ICD-10-CM | POA: Diagnosis not present

## 2020-01-27 DIAGNOSIS — Z124 Encounter for screening for malignant neoplasm of cervix: Secondary | ICD-10-CM | POA: Diagnosis not present

## 2020-01-27 DIAGNOSIS — Z1151 Encounter for screening for human papillomavirus (HPV): Secondary | ICD-10-CM | POA: Diagnosis not present

## 2020-01-27 DIAGNOSIS — Z01419 Encounter for gynecological examination (general) (routine) without abnormal findings: Secondary | ICD-10-CM | POA: Diagnosis not present

## 2020-01-27 DIAGNOSIS — Z6825 Body mass index (BMI) 25.0-25.9, adult: Secondary | ICD-10-CM | POA: Diagnosis not present

## 2020-01-28 DIAGNOSIS — R7309 Other abnormal glucose: Secondary | ICD-10-CM | POA: Diagnosis not present

## 2020-01-28 DIAGNOSIS — Z0000 Encounter for general adult medical examination without abnormal findings: Secondary | ICD-10-CM | POA: Diagnosis not present

## 2020-01-28 DIAGNOSIS — E559 Vitamin D deficiency, unspecified: Secondary | ICD-10-CM | POA: Diagnosis not present

## 2020-01-28 DIAGNOSIS — E890 Postprocedural hypothyroidism: Secondary | ICD-10-CM | POA: Diagnosis not present

## 2020-08-01 ENCOUNTER — Emergency Department: Admit: 2020-08-01 | Discharge status: Against Medical Advice | Payer: Self-pay

## 2020-08-14 ENCOUNTER — Other Ambulatory Visit: Payer: Self-pay

## 2020-08-14 ENCOUNTER — Emergency Department
Admission: RE | Admit: 2020-08-14 | Discharge: 2020-08-14 | Disposition: A | Discharge status: Home / Self Care | Payer: BC Managed Care – PPO | Source: Ambulatory Visit

## 2020-08-14 VITALS — BP 118/81 | HR 88 | Temp 99.1°F | Ht 67.0 in | Wt 163.0 lb

## 2020-08-14 DIAGNOSIS — H6503 Acute serous otitis media, bilateral: Secondary | ICD-10-CM | POA: Diagnosis not present

## 2020-08-14 MED ORDER — AZITHROMYCIN 250 MG PO TABS
ORAL_TABLET | ORAL | 0 refills | Status: DC
Start: 2020-08-14 — End: 2023-02-24

## 2020-08-14 NOTE — ED Triage Notes (Signed)
Patient c/o fluid in ears and dizziness since Tuesday.  Patient is also having sinus pressure, pain around eyes, teeth are aching.  Patient has tried Sudafed and Tylenol.  Patient is vaccinated.

## 2020-08-14 NOTE — ED Provider Notes (Signed)
[facility]    CSN: [id number] Arrival date & time: 08/14/20  1212      History   Chief Complaint Chief Complaint  Patient presents with  . Appointment    HPI Alice Mccann is a 45 y.o. female.  Patient complains of stopped up feeling in her ears and sinuses.  Complains that her teeth are aching.  She has been using Sudafed and Tylenol.  Also complains of some dizziness.   HPI  Past Medical History:  Diagnosis Date  . Thyroid cancer (HCC)   . Thyroid nodule     Patient Active Problem List   Diagnosis Date Noted  . Infraspinatus tendinitis, left 09/19/2016  . Closed fracture of radial styloid 06/01/2014  . Thyroid cancer, follicular variant of papillary carcinoma, multifocal, T3, N0, Mx 08/28/2011  . URI 01/15/2009    Past Surgical History:  Procedure Laterality Date  . KNEE SURGERY  02/2004  . TOTAL THYROIDECTOMY  05/30/2010  . TUMOR EXCISION  01/23/08   spine    OB History   No obstetric history on file.      Home Medications    Prior to Admission medications   Medication Sig Start Date End Date Taking? Authorizing Provider  levothyroxine (SYNTHROID, LEVOTHROID) 175 MCG tablet Take 175 mcg by mouth daily before breakfast.   Yes [provider]  azithromycin (ZITHROMAX) 250 MG tablet Take 2 tabs PO x 1 dose, then 1 tab PO QD x 4 days 08/04/17   Collene Gobble, MD  benzonatate (TESSALON) 100 MG capsule Take 1-2 capsules (100-200 mg total) by mouth 3 (three) times daily as needed for cough. 08/04/17   Collene Gobble, MD  liothyronine (CYTOMEL) 5 MCG tablet Take 5 mcg by mouth daily.    [provider]    Family History Family History  Problem Relation Age of Onset  . Hypertension Mother   . Diabetes Sister     Social History Social History   Tobacco Use  . Smoking status: Never Smoker  . Smokeless tobacco: Never Used  Substance Use Topics  . Alcohol use: No  . Drug use: No     Allergies   Avelox [moxifloxacin  hcl in nacl], Moxifloxacin, Penicillins, and Sulfonamide derivatives   Review of Systems Review of Systems  HENT: Positive for congestion, ear pain, sinus pressure and sinus pain.   Respiratory: Negative for cough.   All other systems reviewed and are negative.    Physical Exam Triage Vital Signs ED Triage Vitals  Enc Vitals Group     BP 08/14/20 1344 118/81     Pulse Rate 08/14/20 1344 88     Resp --      Temp 08/14/20 1344 99.1 F (37.3 C)     Temp Source 08/14/20 1344 Oral     SpO2 08/14/20 1344 100 %     Weight 08/14/20 1345 163 lb (73.9 kg)     Height 08/14/20 1345 5\' 7"  (1.702 m)     Head Circumference --      Peak Flow --      Pain Score 08/14/20 1344 5     Pain Loc --      Pain Edu? --      Excl. in GC? --    No data found.  Updated Vital Signs BP 118/81 (BP Location: Left Arm)   Pulse 88   Temp 99.1 F (37.3 C) (Oral)   Ht 5\' 7"  (1.702 m)   Wt 73.9 kg  LMP 07/09/2020   SpO2 100%   BMI 25.53 kg/m   Visual Acuity Right Eye Distance:   Left Eye Distance:   Bilateral Distance:    Right Eye Near:   Left Eye Near:    Bilateral Near:     Physical Exam Vitals and nursing note reviewed.  Constitutional:      Appearance: Normal appearance.  HENT:     Head:     Comments: Tenderness with percussion over maxillary sinuses    Right Ear: Tympanic membrane normal.     Left Ear: Tympanic membrane normal.     Ears:     Comments: Ears do not move with Valsalva maneuver Cardiovascular:     Rate and Rhythm: Normal rate and regular rhythm.  Pulmonary:     Effort: Pulmonary effort is normal.     Breath sounds: Normal breath sounds.  Neurological:     General: No focal deficit present.     Mental Status: She is alert and oriented to person, place, and time.      UC Treatments / Results  Labs (all labs ordered are listed, but only abnormal results are displayed) Labs Reviewed - No data to display  EKG   Radiology No results  found.  Procedures Procedures (including critical care time)  Medications Ordered in UC Medications - No data to display  Initial Impression / Assessment and Plan / UC Course  I have reviewed the triage vital signs and the nursing notes.  Pertinent labs & imaging results that were available during my care of the patient were reviewed by me and considered in my medical decision making (see chart for details).     Sinusitis and serous effusion Final Clinical Impressions(s) / UC Diagnoses   Final diagnoses:  None   Discharge Instructions   None    ED Prescriptions    None     PDMP not reviewed this encounter.   Wardell Honour, MD 08/14/20 [phone]

## 2023-02-24 ENCOUNTER — Other Ambulatory Visit: Payer: Self-pay

## 2023-02-24 ENCOUNTER — Ambulatory Visit
Admission: RE | Admit: 2023-02-24 | Discharge: 2023-02-24 | Disposition: A | Discharge status: Home / Self Care | Payer: 59 | Source: Ambulatory Visit | Attending: Family Medicine | Admitting: Family Medicine

## 2023-02-24 VITALS — BP 107/75 | HR 76 | Temp 97.7°F | Resp 16

## 2023-02-24 DIAGNOSIS — H6692 Otitis media, unspecified, left ear: Secondary | ICD-10-CM | POA: Diagnosis not present

## 2023-02-24 MED ORDER — AZITHROMYCIN 500 MG PO TABS: 500.0000 mg | ORAL_TABLET | Freq: Every day | ORAL | 0 refills | Status: AC

## 2023-02-24 NOTE — Discharge Instructions (Addendum)
Advised patient to take medication as directed with food to completion.  Encouraged increase daily water intake to 64 ounces per day while taking this medication.  Advised patient not to submerge head underwater for the next 7 days.

## 2023-02-24 NOTE — ED Triage Notes (Signed)
Pt presents to uc with co of dizziness since Wednesday and strange feeling in left ear.pt has been taking dramamine for the dizziness.

## 2023-02-24 NOTE — ED Provider Notes (Signed)
[facility]    CSN: [id number] Arrival date & time: 02/24/23  1121      History   Chief Complaint Chief Complaint  Patient presents with   Dizziness    Have had dizziness since Wednesday with some left ear pain. - Entered by patient    HPI Alice Mccann is a 47 y.o. female.   HPI 47 year old female presents with dizziness and strange feeling in the left ear for 3 days.  PMH significant for thyroid cancer.  Past Medical History:  Diagnosis Date   Thyroid cancer [facility])    Thyroid nodule     Patient Active Problem List   Diagnosis Date Noted   Infraspinatus tendinitis, left 09/19/2016   Closed fracture of radial styloid 06/01/2014   Thyroid cancer, follicular variant of papillary carcinoma, multifocal, T3, N0, Mx 08/28/2011   URI 01/15/2009    Past Surgical History:  Procedure Laterality Date   KNEE SURGERY  02/2004   TOTAL THYROIDECTOMY  05/30/2010   TUMOR EXCISION  01/23/08   spine    OB History   No obstetric history on file.      Home Medications    Prior to Admission medications   Medication Sig Start Date End Date Taking? Authorizing Provider  azithromycin (ZITHROMAX) 500 MG tablet Take 1 tablet (500 mg total) by mouth daily for 5 days. 02/24/23 03/01/23 Yes Trevor Iha, FNP  levothyroxine (SYNTHROID, LEVOTHROID) 175 MCG tablet Take 175 mcg by mouth daily before breakfast.    [provider]  liothyronine (CYTOMEL) 5 MCG tablet Take 5 mcg by mouth daily.    [provider]    Family History Family History  Problem Relation Age of Onset   Hypertension Mother    Diabetes Sister     Social History Social History   Tobacco Use   Smoking status: Never   Smokeless tobacco: Never  Substance Use Topics   Alcohol use: No   Drug use: No     Allergies   Avelox [moxifloxacin hcl in nacl], Moxifloxacin, Penicillins, and Sulfonamide derivatives   Review of Systems Review of Systems   Physical Exam Triage Vital  Signs ED Triage Vitals  Encounter Vitals Group     BP 02/24/23 1134 107/75     Systolic BP Percentile --      Diastolic BP Percentile --      Pulse Rate 02/24/23 1134 76     Resp 02/24/23 1134 16     Temp 02/24/23 1134 97.7 F (36.5 C)     Temp src --      SpO2 02/24/23 1134 98 %     Weight --      Height --      Head Circumference --      Peak Flow --      Pain Score 02/24/23 1133 0     Pain Loc --      Pain Education --      Exclude from Growth Chart --    No data found.  Updated Vital Signs BP 107/75   Pulse 76   Temp 97.7 F (36.5 C)   Resp 16   LMP 02/20/2023   SpO2 98%   Visual Acuity Right Eye Distance:   Left Eye Distance:   Bilateral Distance:    Right Eye Near:   Left Eye Near:    Bilateral Near:     Physical Exam Vitals and nursing note reviewed.  Constitutional:      General: She  is not in acute distress.    Appearance: Normal appearance. She is obese. She is not ill-appearing.  HENT:     Head: Normocephalic and atraumatic.     Right Ear: Tympanic membrane and external ear normal.     Left Ear: External ear normal.     Ears:     Comments: Moderate eustachian tube dysfunction noted bilaterally; left TM-red rimmed, retracted    Mouth/Throat:     Mouth: Mucous membranes are moist.     Pharynx: Oropharynx is clear.  Eyes:     Extraocular Movements: Extraocular movements intact.     Conjunctiva/sclera: Conjunctivae normal.     Pupils: Pupils are equal, round, and reactive to light.  Cardiovascular:     Rate and Rhythm: Normal rate and regular rhythm.     Pulses: Normal pulses.     Heart sounds: Normal heart sounds.  Pulmonary:     Effort: Pulmonary effort is normal.     Breath sounds: Normal breath sounds. No wheezing, rhonchi or rales.  Musculoskeletal:        General: Normal range of motion.     Cervical back: Normal range of motion and neck supple.  Skin:    General: Skin is warm and dry.  Neurological:     General: No focal deficit  present.     Mental Status: She is alert and oriented to person, place, and time. Mental status is at baseline.  Psychiatric:        Mood and Affect: Mood normal.        Behavior: Behavior normal.      UC Treatments / Results  Labs (all labs ordered are listed, but only abnormal results are displayed) Labs Reviewed - No data to display  EKG   Radiology No results found.  Procedures Procedures (including critical care time)  Medications Ordered in UC Medications - No data to display  Initial Impression / Assessment and Plan / UC Course  I have reviewed the triage vital signs and the nursing notes.  Pertinent labs & imaging results that were available during my care of the patient were reviewed by me and considered in my medical decision making (see chart for details).     MDM: 1.  Acute left otitis media-Rx'd Zithromax 500 mg tablet daily x 5 days. Advised patient to take medication as directed with food to completion.  Encouraged increase daily water intake to 64 ounces per day while taking this medication.  Advised patient not to submerge head underwater for the next 7 days.  Patient discharged home, hemodynamically stable.  Final Clinical Impressions(s) / UC Diagnoses   Final diagnoses:  Left acute otitis media     Discharge Instructions      Advised patient to take medication as directed with food to completion.  Encouraged increase daily water intake to 64 ounces per day while taking this medication.  Advised patient not to submerge head underwater for the next 7 days.     ED Prescriptions     Medication Sig Dispense Auth. Provider   azithromycin (ZITHROMAX) 500 MG tablet Take 1 tablet (500 mg total) by mouth daily for 5 days. 5 each Trevor Iha, FNP      PDMP not reviewed this encounter.   Trevor Iha, FNP 02/24/23 1229

## 2024-08-14 ENCOUNTER — Ambulatory Visit
Admission: RE | Admit: 2024-08-14 | Discharge: 2024-08-14 | Disposition: A | Discharge status: Home / Self Care | Source: Home / Self Care

## 2024-08-14 VITALS — BP 115/76 | HR 76 | Temp 98.2°F | Resp 18 | Wt 186.3 lb

## 2024-08-14 DIAGNOSIS — J0191 Acute recurrent sinusitis, unspecified: Secondary | ICD-10-CM

## 2024-08-14 MED ORDER — DOXYCYCLINE HYCLATE 100 MG PO CAPS: 100.0000 mg | ORAL_CAPSULE | Freq: Two times a day (BID) | ORAL | 0 refills | Status: AC

## 2024-08-14 MED ORDER — PREDNISONE 20 MG PO TABS: 40.0000 mg | ORAL_TABLET | Freq: Every day | ORAL | 0 refills | Status: AC

## 2024-08-14 NOTE — ED Provider Notes (Signed)
 " [facility]    CSN: [id number] Arrival date & time: 08/14/24  1852      History   Chief Complaint Chief Complaint  Patient presents with   Cough    Entered by patient   Facial Pain    HPI KHANH CORDNER is a 49 y.o. female.   HPI  Patient states she has recurring sinusitis.  She gets it a couple times a year.  She has sinus symptoms now.  Facial pain or pressure.  Teeth hurt.  Sinuses hurt.  Yellow and green mucus.  Postnasal drip.  Cough.  Symptoms have been present for 4 days.  No fever or chills.  No body aches or flu exposure  Past Medical History:  Diagnosis Date   Thyroid  cancer (HCC)    Thyroid  nodule     Patient Active Problem List   Diagnosis Date Noted   Infraspinatus tendinitis, left 09/19/2016   Closed fracture of radial styloid 06/01/2014   Thyroid  cancer, follicular variant of papillary carcinoma, multifocal, T3, N0, Mx 08/28/2011   Acute upper respiratory infection 01/15/2009    Past Surgical History:  Procedure Laterality Date   KNEE SURGERY  02/2004   TOTAL THYROIDECTOMY  05/30/2010   TUMOR EXCISION  01/23/08   spine    OB History   No obstetric history on file.      Home Medications    Prior to Admission medications  Medication Sig Start Date End Date Taking? Authorizing Provider  doxycycline (VIBRAMYCIN) 100 MG capsule Take 1 capsule (100 mg total) by mouth 2 (two) times daily. 08/14/24  Yes Maranda Jamee Jacob, MD  predniSONE (DELTASONE) 20 MG tablet Take 2 tablets (40 mg total) by mouth daily with breakfast. 08/14/24  Yes Maranda Jamee Jacob, MD  estradiol (VIVELLE-DOT) 0.05 MG/24HR patch APPLY 1 PATCH TRANSDERMALLY TWICE A WEEK    [provider]  levothyroxine (SYNTHROID, LEVOTHROID) 175 MCG tablet Take 175 mcg by mouth daily before breakfast.    [provider]  liothyronine (CYTOMEL) 5 MCG tablet Take 5 mcg by mouth daily.    [provider]  progesterone (PROMETRIUM) 100 MG capsule TAKE 1 CAPSULE  BY MOUTH EVERY DAY FOR 90 DAYS    [provider]    Family History Family History  Problem Relation Age of Onset   Hypertension Mother    Diabetes Sister     Social History Social History[1]   Allergies   Avelox [moxifloxacin hcl in nacl], Moxifloxacin, Penicillins, and Sulfonamide derivatives   Review of Systems Review of Systems  See HPI Physical Exam Triage Vital Signs ED Triage Vitals  Encounter Vitals Group     BP 08/14/24 1905 115/76     Girls Systolic BP Percentile --      Girls Diastolic BP Percentile --      Boys Systolic BP Percentile --      Boys Diastolic BP Percentile --      Pulse Rate 08/14/24 1905 76     Resp 08/14/24 1905 18     Temp 08/14/24 1905 98.2 F (36.8 C)     Temp Source 08/14/24 1905 Oral     SpO2 08/14/24 1905 97 %     Weight 08/14/24 1903 186 lb 4.8 oz (84.5 kg)     Height --      Head Circumference --      Peak Flow --      Pain Score 08/14/24 1902 5     Pain Loc --  Pain Education --      Exclude from Growth Chart --    No data found.  Updated Vital Signs BP 115/76 (BP Location: Right Arm)   Pulse 76   Temp 98.2 F (36.8 C) (Oral)   Resp 18   Wt 84.5 kg   SpO2 97%   BMI 29.18 kg/m   Visual Acuity Right Eye Distance:   Left Eye Distance:   Bilateral Distance:    Right Eye Near:   Left Eye Near:    Bilateral Near:     Physical Exam Constitutional:      General: She is not in acute distress.    Appearance: She is well-developed.  HENT:     Head: Normocephalic and atraumatic.     Right Ear: Tympanic membrane normal.     Left Ear: Tympanic membrane normal.     Nose: Congestion and rhinorrhea present.     Mouth/Throat:     Pharynx: Posterior oropharyngeal erythema present.     Comments: Facial sinuses are tender Eyes:     Conjunctiva/sclera: Conjunctivae normal.     Pupils: Pupils are equal, round, and reactive to light.  Cardiovascular:     Rate and Rhythm: Normal rate and regular rhythm.      Heart sounds: Normal heart sounds.  Pulmonary:     Effort: Pulmonary effort is normal. No respiratory distress.     Breath sounds: Normal breath sounds.  Musculoskeletal:        General: Normal range of motion.     Cervical back: Normal range of motion.  Lymphadenopathy:     Cervical: No cervical adenopathy.  Skin:    General: Skin is warm and dry.  Neurological:     Mental Status: She is alert.      UC Treatments / Results  Labs (all labs ordered are listed, but only abnormal results are displayed) Labs Reviewed - No data to display  EKG   Radiology No results found.  Procedures Procedures (including critical care time)  Medications Ordered in UC Medications - No data to display  Initial Impression / Assessment and Plan / UC Course  I have reviewed the triage vital signs and the nursing notes.  Pertinent labs & imaging results that were available during my care of the patient were reviewed by me and considered in my medical decision making (see chart for details).     I explained to the patient that current medical recommendations are waiting 10 days after sinus symptoms to try an antibiotic.  Most of them are caused by viruses will go away with expectant management.  Patient has a long history of recurring sinus infections and feels like she will need an antibiotic to improve.  She agrees to a written prescription and holding off as long as she can to fill this if she fails to improve Final Clinical Impressions(s) / UC Diagnoses   Final diagnoses:  Acute recurrent sinusitis, unspecified location     Discharge Instructions      Take the prednisone daily for 5 days Use salt water or saline nasal rinses Drink lots of fluids  Take the antibiotic if you improve with conservative treatment   ED Prescriptions     Medication Sig Dispense Auth. Provider   doxycycline (VIBRAMYCIN) 100 MG capsule Take 1 capsule (100 mg total) by mouth 2 (two) times daily. 20  capsule Maranda Jamee Jacob, MD   predniSONE (DELTASONE) 20 MG tablet Take 2 tablets (40 mg total) by mouth daily with  breakfast. 10 tablet Maranda Jamee Jacob, MD      PDMP not reviewed this encounter.    [1]  Social History Tobacco Use   Smoking status: Never   Smokeless tobacco: Never  Vaping Use   Vaping status: Never Used  Substance Use Topics   Alcohol use: No   Drug use: No     Maranda Jamee Jacob, MD 08/14/24 1928  "

## 2024-08-14 NOTE — ED Triage Notes (Signed)
 Patient states 3 days of cough that's constant and burns her chest, 2 days of sinus/face pain.  No fever.  Taking Tylenol sinus with little relief

## 2024-08-14 NOTE — Discharge Instructions (Signed)
 Take the prednisone daily for 5 days Use salt water or saline nasal rinses Drink lots of fluids  Take the antibiotic if you improve with conservative treatment
